# Patient Record
Sex: Male | Born: 1985 | Hispanic: Yes | Marital: Married
Health system: Southern US, Community
[De-identification: ages and names within clinical notes are randomized; demographics above are authoritative.]

## PROBLEM LIST (undated history)

## (undated) DIAGNOSIS — S064X9A Epidural hemorrhage with loss of consciousness of unspecified duration, initial encounter: Secondary | ICD-10-CM

## (undated) DIAGNOSIS — S069X9A Unspecified intracranial injury with loss of consciousness of unspecified duration, initial encounter: Secondary | ICD-10-CM

## (undated) DIAGNOSIS — H9190 Unspecified hearing loss, unspecified ear: Secondary | ICD-10-CM

## (undated) DIAGNOSIS — J9600 Acute respiratory failure, unspecified whether with hypoxia or hypercapnia: Secondary | ICD-10-CM

## (undated) HISTORY — PX: BRAIN SURGERY: SHX531

## (undated) HISTORY — DX: Unspecified intracranial injury with loss of consciousness of unspecified duration, initial encounter: S06.9X9A

## (undated) HISTORY — PX: FRACTURE SURGERY: SHX138

## (undated) HISTORY — DX: Epidural hemorrhage with loss of consciousness of unspecified duration, initial encounter: S06.4X9A

## (undated) HISTORY — DX: Acute respiratory failure, unspecified whether with hypoxia or hypercapnia: J96.00

## (undated) HISTORY — DX: Unspecified hearing loss, unspecified ear: H91.90

---

## 2008-03-17 ENCOUNTER — Inpatient Hospital Stay: Payer: Self-pay | Admitting: Orthopedic Surgery

## 2012-02-14 LAB — COMPREHENSIVE METABOLIC PANEL
Anion Gap: 7 (ref 7–16)
Bilirubin,Total: 0.5 mg/dL (ref 0.2–1.0)
Calcium, Total: 9 mg/dL (ref 8.5–10.1)
Chloride: 104 mmol/L (ref 98–107)
Co2: 25 mmol/L (ref 21–32)
Creatinine: 0.8 mg/dL (ref 0.60–1.30)
EGFR (African American): >60
Glucose: 96 mg/dL (ref 65–99)
Potassium: 3.3 mmol/L — ABNORMAL LOW (ref 3.5–5.1)
SGOT(AST): 22 U/L (ref 15–37)
Sodium: 136 mmol/L (ref 136–145)
Total Protein: 8.4 g/dL — ABNORMAL HIGH (ref 6.4–8.2)

## 2012-02-14 LAB — URINALYSIS, COMPLETE
Bacteria: NONE SEEN
Bilirubin,UR: NEGATIVE
Glucose,UR: NEGATIVE mg/dL (ref 0–75)
Ketone: NEGATIVE
Ph: 6 (ref 4.5–8.0)
RBC,UR: NONE SEEN /HPF (ref 0–5)
Squamous Epithelial: NONE SEEN
WBC UR: NONE SEEN /HPF (ref 0–5)

## 2012-02-14 LAB — DRUG SCREEN, URINE
Barbiturates, Ur Screen: NEGATIVE (ref ?–200)
Cannabinoid 50 Ng, Ur ~~LOC~~: NEGATIVE (ref ?–50)
Cocaine Metabolite,Ur ~~LOC~~: NEGATIVE (ref ?–300)
MDMA (Ecstasy)Ur Screen: NEGATIVE (ref ?–500)
Methadone, Ur Screen: NEGATIVE (ref ?–300)
Opiate, Ur Screen: NEGATIVE (ref ?–300)
Phencyclidine (PCP) Ur S: NEGATIVE (ref ?–25)

## 2012-02-14 LAB — ETHANOL: Ethanol: 209 mg/dL

## 2012-02-14 LAB — CBC
HCT: 44.6 % (ref 40.0–52.0)
MCHC: 33 g/dL (ref 32.0–36.0)
Platelet: 187 10*3/uL (ref 150–440)
RBC: 5.2 10*6/uL (ref 4.40–5.90)
RDW: 13.2 % (ref 11.5–14.5)
WBC: 8 10*3/uL (ref 3.8–10.6)

## 2012-02-15 ENCOUNTER — Inpatient Hospital Stay: Payer: Self-pay | Admitting: Psychiatry

## 2012-10-19 ENCOUNTER — Emergency Department: Payer: Self-pay | Admitting: Emergency Medicine

## 2012-10-19 DIAGNOSIS — J9600 Acute respiratory failure, unspecified whether with hypoxia or hypercapnia: Secondary | ICD-10-CM

## 2012-10-19 DIAGNOSIS — S064X9A Epidural hemorrhage with loss of consciousness of unspecified duration, initial encounter: Secondary | ICD-10-CM

## 2012-10-19 DIAGNOSIS — S069X9A Unspecified intracranial injury with loss of consciousness of unspecified duration, initial encounter: Secondary | ICD-10-CM

## 2012-10-19 DIAGNOSIS — S069XAA Unspecified intracranial injury with loss of consciousness status unknown, initial encounter: Secondary | ICD-10-CM

## 2012-10-19 DIAGNOSIS — S064XAA Epidural hemorrhage with loss of consciousness status unknown, initial encounter: Secondary | ICD-10-CM | POA: Insufficient documentation

## 2012-10-19 HISTORY — DX: Unspecified intracranial injury with loss of consciousness of unspecified duration, initial encounter: S06.9X9A

## 2012-10-19 HISTORY — DX: Unspecified intracranial injury with loss of consciousness status unknown, initial encounter: S06.9XAA

## 2012-10-19 HISTORY — DX: Acute respiratory failure, unspecified whether with hypoxia or hypercapnia: J96.00

## 2012-10-19 HISTORY — DX: Epidural hemorrhage with loss of consciousness status unknown, initial encounter: S06.4XAA

## 2012-10-19 HISTORY — DX: Epidural hemorrhage with loss of consciousness of unspecified duration, initial encounter: S06.4X9A

## 2012-10-19 LAB — COMPREHENSIVE METABOLIC PANEL
Albumin: 4.1 g/dL (ref 3.4–5.0)
Alkaline Phosphatase: 104 U/L (ref 50–136)
Bilirubin,Total: 0.3 mg/dL (ref 0.2–1.0)
Calcium, Total: 8.5 mg/dL (ref 8.5–10.1)
Chloride: 105 mmol/L (ref 98–107)
Co2: 24 mmol/L (ref 21–32)
EGFR (Non-African Amer.): >60
Potassium: 3 mmol/L — ABNORMAL LOW (ref 3.5–5.1)
SGPT (ALT): 27 U/L (ref 12–78)
Sodium: 138 mmol/L (ref 136–145)
Total Protein: 8.2 g/dL (ref 6.4–8.2)

## 2012-10-19 LAB — APTT: Activated PTT: 25.9 secs (ref 23.6–35.9)

## 2012-10-19 LAB — URINALYSIS, COMPLETE
Bacteria: NONE SEEN
Glucose,UR: NEGATIVE mg/dL (ref 0–75)
Leukocyte Esterase: NEGATIVE
Ph: 6 (ref 4.5–8.0)
RBC,UR: <1 /HPF (ref 0–5)
Specific Gravity: 1.005 (ref 1.003–1.030)
WBC UR: <1 /HPF (ref 0–5)

## 2012-10-19 LAB — CBC WITH DIFFERENTIAL/PLATELET
Basophil %: 0.8 %
Eosinophil #: 0.3 10*3/uL (ref 0.0–0.7)
Eosinophil %: 2.6 %
HGB: 15.1 g/dL (ref 13.0–18.0)
Lymphocyte #: 3.4 10*3/uL (ref 1.0–3.6)
Lymphocyte %: 33 %
MCH: 29.2 pg (ref 26.0–34.0)
MCHC: 34.4 g/dL (ref 32.0–36.0)
MCV: 85 fL (ref 80–100)
Monocyte %: 5 %
Neutrophil #: 6.1 10*3/uL (ref 1.4–6.5)
Neutrophil %: 58.6 %
RBC: 5.17 10*6/uL (ref 4.40–5.90)
RDW: 13.4 % (ref 11.5–14.5)
WBC: 10.3 10*3/uL (ref 3.8–10.6)

## 2012-10-19 LAB — ETHANOL: Ethanol %: 0.178 % — ABNORMAL HIGH (ref 0.000–0.080)

## 2012-10-19 LAB — PROTIME-INR
INR: 1
Prothrombin Time: 13 secs (ref 11.5–14.7)

## 2012-10-19 LAB — LIPASE, BLOOD: Lipase: 80 U/L (ref 73–393)

## 2012-12-06 DIAGNOSIS — H9190 Unspecified hearing loss, unspecified ear: Secondary | ICD-10-CM

## 2012-12-06 HISTORY — DX: Unspecified hearing loss, unspecified ear: H91.90

## 2014-06-16 NOTE — Discharge Summary (Signed)
PATIENT NAME:  Darrell Brooks, Darrell Brooks MR#:  [id number] DATE OF BIRTH:  [birth year]  DATE OF ADMISSION:  02/15/2012 DATE OF DISCHARGE:  02/17/2012  HOSPITAL COURSE: See dictated history and physical for details of admission. This 29 year old man came in through the Emergency Room after cutting his left wrist while intoxicated. At the time he did it, he was feeling hopeless and upset with his relationship with his wife and was having some suicidal thoughts. In the hospital, he was cooperative with treatment. His wound was dressed and he was admitted to the psychiatry ward. He has been able to detox off of alcohol without complications. After sobering up, his mood is much improved. He denies feeling depressed. He denied suicidal or homicidal ideation. He is lucid without any loosening of associations or delusions. He gives a history that is not particularly consistent with a full major depressive episode, but with multiple life stresses as well as the use of alcohol. The patient did not clearly need medication and antidepressants were not started. He preferred it this way. He was engaged in groups and activities, and given individual therapy. The patient met with his wife and says that it has been a good reconciliation and he feels better about the relationship. He is agreeable to the idea of going for outpatient followup in the community and has been counseled about the dangers of continued alcohol abuse and that he should stay away from alcohol at this time. He agrees to all of that. He will be discharged with followup appointment at [facility].   DISCHARGE MEDICATIONS: None.   LABORATORY RESULTS: Alcohol level was 209. TSH 2.7, which is normal. Potassium slightly low at 3.3, total protein elevated at 8.4. CBC normal. Drug screen negative. Urinalysis unremarkable.   MENTAL STATUS EXAM AT DISCHARGE: Young man who looks his stated age, appropriate and cooperative with the interaction. Good eye contact. Normal psychomotor  activity. No sign of tremor. Speech normal rate, tone, and volume. Affect smiling, upbeat, reactive and appropriate. Mood stated as being good. Thoughts are lucid with no loosening of associations. No evidence of delusions or loose or psychotic thinking. Denies auditory or visual hallucinations. Denies suicidal or homicidal ideation. Shows good judgment and insight. Normal intelligence. Alert and oriented x 4, only some memory loss of the time he was intoxicated.   DIAGNOSIS PRINCIPLE AND PRIMARY:   AXIS I: Adjustment disorder with mixed behavior and mood disturbance.   SECONDARY DIAGNOSES: AXIS I: Alcohol abuse.  AXIS II: Deferred.  AXIS III: Cuts to his wrists, healing.  AXIS IV: Moderate from difficulty in his relationship with his wife.  AXIS V: Functioning at time of discharge 63.     ____________________________ Gonzella Lex, MD jtc:cs D: 02/17/2012 12:02:29 ET T: 02/18/2012 19:22:53 ET JOB#: [id number]  cc: Gonzella Lex, MD, <Dictator> Gonzella Lex MD ELECTRONICALLY SIGNED 02/19/2012 13:08

## 2014-06-16 NOTE — H&P (Signed)
PATIENT NAME:  Darrell Brooks, Darrell Brooks MR#:  [id number] DATE OF BIRTH:  07-Jan-1986  DATE OF ADMISSION:  02/15/2012  IDENTIFYING INFORMATION AND CHIEF COMPLAINT: A 29 year old man admitted through the Emergency Room on involuntary commitment because of self-inflicted lacerations with suicidal ideation.   CHIEF COMPLAINT: "I just cut myself."   HISTORY OF PRESENT ILLNESS: The patient states that on the day of admission, he and his wife had been having an extended argument for over a day. It revolved around his being jealous of something he discovered on her Facebook page. Apparently the argument ended with her going off to work and he continued to feel upset about it. Later on in the day, he was at home when he decided to drink some beer. He cannot remember exactly how much he drank but figures it was at least three. At some point, he impulsively got the idea that his wife was definitely going to leave him and he should kill himself. He cut his left arm at least twice and decided that he wanted to bleed to death. He was holding his arm over a sink and texting to some relatives of his. They texted or communicated with his wife, and eventually EMS was called and the patient was brought to the Emergency Room. He was intoxicated on arrival here. Drug screen otherwise negative. The patient says that prior to the episode of cutting himself, his mood had been a little bit up and down recently because of ongoing problems he is having with his wife. He and his wife separated last year and just got back together in October. Ever since then, he feels like she has been distant. He feels unsatisfied with the relationship and has been more nervous. He denies that he has been having any suicidal thoughts. He denies any psychotic symptoms. Sleep is generally okay and he does not report any new physical symptoms.   PAST PSYCHIATRIC HISTORY: He denies that he has ever been in a psychiatric hospital and denies that he has ever seen a  mental health provider or been on any medication in the past. As a much younger man, he used to cut himself very superficially on the chest when he was upset but has not done any other self-mutilation and he denies any other suicide attempts.   SUBSTANCE ABUSE HISTORY: He states that normally he drinks only moderately and infrequently. He says the amount that he drank the other day was much in excess of his usual consumption. He denies that he abuses any other drugs.   FAMILY HISTORY: He has a sister who has some mood problems, otherwise negative family history.   SOCIAL HISTORY: The patient has been married for 4 years to his wife. They have a 29-year-old daughter. 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 [place] and his family lives around here. He and his wife separated last year and recently reunited. They are not seeing any kind of counselor.   PAST MEDICAL HISTORY: No significant known ongoing medical problems. He broke his leg a couple of years ago and that his only prior contact I can identify.   REVIEW OF SYSTEMS: He currently does not have many complaints. He says his mood is feeling better. He is not feeling very tired. Denies any suicidal ideation. Denies psychotic symptoms. Denies homicidal ideation. Denies panic attacks. Denies nausea or GI complaints.   MENTAL STATUS EXAMINATION: Casually dressed young man who appears to be in no acute distress and is cooperative with the  interview. Eye contact is good. Psychomotor activity normal. Speech is normal in rate, tone and volume. Affect is reactive, euthymic and appropriate. Mood is stated as being okay. Thoughts are lucid without any loosening of association. No sign of delusions. Denies auditory or visual hallucinations. Denies suicidal or homicidal ideation. Judgment and insight currently appear to be much improved and probably back to baseline. Baseline intelligence appears to be normal. Short and longer term memory  intact except for the time during which he was intoxicated.   PHYSICAL EXAMINATION:  GENERAL: The patient has a bandage on his left wrist where he cut himself. No other skin lesions identified.  HEENT: Pupils are equal and reactive, and face is symmetric. Oral mucosa moist.  MUSCULOSKELETAL: Neck and back nontender to palpation. Full range of motion at all extremities.  NEUROLOGIC: Normal gait. Strength and reflexes symmetric and normal throughout. Cranial nerves symmetric.  LUNGS: Clear with no wheezes.  HEART: Regular rate and rhythm.  ABDOMEN: Soft, nontender. Normal bowel sounds.  VITAL SIGNS: Blood pressure currently 101/56, respirations 20, pulse 70, temperature 97.9.   MEDICATIONS: None.   ALLERGIES: No known drug allergies.   LABORATORY RESULTS: Alcohol level in the Emergency Room was 209. Chemistry panel showed potassium slightly low at 3.3, total protein slightly low at 8.4. CBC normal. Urinalysis normal and drug screen negative.   ASSESSMENT: A 29 year old man who impulsively cut himself while he was intoxicated. He denies symptoms consistent with a major depression prior to that. He does give a past history of having been impulsive and irritable in the past and sometimes acting without thinking. He denies that he has ever tried to kill himself, but he has done some minor self-mutilation in the past. He denies that he has a substance abuse problem but was intoxicated at the time. Going through significant stress in his relationship with his wife.   TREATMENT PLAN: The patient has been admitted to the hospital for safety. Continue on current suicide precautions. Evaluate labs. Evaluate vital signs. Include him in groups and activities as appropriate. Supportive and educational therapy done. Perhaps try to get collateral information. Work on appropriate disposition including referral for outpatient mental health treatment.   DIAGNOSIS PRINCIPLE AND PRIMARY:  AXIS I:  1.  Adjustment  disorder with mixed disturbance of mood and conduct.  2.  Alcohol abuse.  AXIS II: None. AXIS III: Superficial lacerations to left wrist, currently treated.  AXIS IV: Moderate to severe from the relationship with his wife.  AXIS V: Functioning at time of evaluation 55.   ____________________________ Audery Amel, MD jtc:jm D: 02/15/2012 16:36:11 ET T: 02/15/2012 17:38:17 ET JOB#: [id number]  cc: Audery Amel, MD, <Dictator> Audery Amel MD ELECTRONICALLY SIGNED 02/15/2012 23:00

## 2018-04-30 ENCOUNTER — Encounter: Payer: Self-pay | Admitting: Emergency Medicine

## 2018-04-30 ENCOUNTER — Other Ambulatory Visit: Payer: Self-pay

## 2018-04-30 DIAGNOSIS — R319 Hematuria, unspecified: Secondary | ICD-10-CM | POA: Diagnosis not present

## 2018-04-30 LAB — URINALYSIS, COMPLETE (UACMP) WITH MICROSCOPIC
BACTERIA UA: NONE SEEN
Bilirubin Urine: NEGATIVE
GLUCOSE, UA: NEGATIVE mg/dL
KETONES UR: NEGATIVE mg/dL
LEUKOCYTE UA: NEGATIVE
Nitrite: NEGATIVE
PROTEIN: NEGATIVE mg/dL
RBC / HPF: >50 RBC/hpf — ABNORMAL HIGH (ref 0–5)
SQUAMOUS EPITHELIAL / LPF: NONE SEEN (ref 0–5)
Specific Gravity, Urine: 1.013 (ref 1.005–1.030)
WBC, UA: NONE SEEN WBC/hpf (ref 0–5)
pH: 6 (ref 5.0–8.0)

## 2018-04-30 LAB — CBC
HCT: 40.3 % (ref 39.0–52.0)
Hemoglobin: 13.8 g/dL (ref 13.0–17.0)
MCH: 28.6 pg (ref 26.0–34.0)
MCHC: 34.2 g/dL (ref 30.0–36.0)
MCV: 83.4 fL (ref 80.0–100.0)
PLATELETS: 216 10*3/uL (ref 150–400)
RBC: 4.83 MIL/uL (ref 4.22–5.81)
RDW: 12.8 % (ref 11.5–15.5)
WBC: 11.4 10*3/uL — AB (ref 4.0–10.5)
nRBC: 0 % (ref 0.0–0.2)

## 2018-04-30 LAB — BASIC METABOLIC PANEL
ANION GAP: 9 (ref 5–15)
BUN: 22 mg/dL — AB (ref 6–20)
CALCIUM: 9.1 mg/dL (ref 8.9–10.3)
CO2: 25 mmol/L (ref 22–32)
Chloride: 105 mmol/L (ref 98–111)
Creatinine, Ser: 1.06 mg/dL (ref 0.61–1.24)
GFR calc Af Amer: >60 mL/min (ref >60)
GLUCOSE: 99 mg/dL (ref 70–99)
POTASSIUM: 3.6 mmol/L (ref 3.5–5.1)
Sodium: 139 mmol/L (ref 135–145)

## 2018-04-30 NOTE — ED Triage Notes (Signed)
Patient to ER for c/o hematuria since today. Patient states he noticed large clots when he urinated, then had blood urine without clots after clots were passed. Denies any history of the same. Patient reports mild pain to left flank.

## 2018-05-01 ENCOUNTER — Encounter: Payer: Self-pay | Admitting: Urology

## 2018-05-01 ENCOUNTER — Emergency Department: Payer: BLUE CROSS/BLUE SHIELD

## 2018-05-01 ENCOUNTER — Emergency Department
Admission: EM | Admit: 2018-05-01 | Discharge: 2018-05-01 | Disposition: A | Discharge status: Home / Self Care | Payer: BLUE CROSS/BLUE SHIELD | Attending: Emergency Medicine | Admitting: Emergency Medicine

## 2018-05-01 ENCOUNTER — Ambulatory Visit (INDEPENDENT_AMBULATORY_CARE_PROVIDER_SITE_OTHER): Payer: BLUE CROSS/BLUE SHIELD | Admitting: Urology

## 2018-05-01 VITALS — BP 144/81 | HR 90 | Ht 68.0 in | Wt 153.0 lb

## 2018-05-01 DIAGNOSIS — R319 Hematuria, unspecified: Secondary | ICD-10-CM | POA: Diagnosis not present

## 2018-05-01 DIAGNOSIS — R310 Gross hematuria: Secondary | ICD-10-CM

## 2018-05-01 DIAGNOSIS — R361 Hematospermia: Secondary | ICD-10-CM

## 2018-05-01 LAB — URINALYSIS, COMPLETE
Bilirubin, UA: NEGATIVE
Glucose, UA: NEGATIVE
Ketones, UA: NEGATIVE
Leukocytes, UA: NEGATIVE
Nitrite, UA: NEGATIVE
Protein, UA: NEGATIVE
RBC, UA: NEGATIVE
Specific Gravity, UA: 1.025 (ref 1.005–1.030)
Urobilinogen, Ur: 0.2 mg/dL (ref 0.2–1.0)
pH, UA: 6 (ref 5.0–7.5)

## 2018-05-01 LAB — BLADDER SCAN AMB NON-IMAGING

## 2018-05-01 NOTE — Progress Notes (Signed)
05/01/2018 4:30 PM   Darrell Brooks 18-Mar-1985 [id number]  Referring provider: No referring provider defined for this encounter.  Chief Complaint  Patient presents with  . Hematuria    New Patient    HPI: 33 yo M referred for further evaluation of several episodes of painless gross hematuria and hematospermia.  He reports having blood in sperm in 04/21/2018 painless with sexual intercourse.  Following this episode, he voided several times which he indicated it was quite bloody with a few occasional clots.  He had no associated dysuria, urgency, frequency, urethral discharge, fevers or chills.  This happened on one subsequent encounter just a few days ago as well.  The situation was essentially the same, following ejaculation/sexual intercourse with subsequent episode of painless gross hematuria which was self-limited.  He denies any trauma with sexual intercourse.  No pain or loss of erection.  Notably, he was seen and evaluated earlier this morning in the emergency room out of concern.  He underwent a CT stone protocol which was unremarkable.  He did take some aspirin yesterday 1 pm for a head ache just prior to this happening.    Today, he denies any urinary symptoms.  He is sexually active with one partner.  No STI exposures of which he is aware.  No history of STIs.   PMH: Past Medical History:  Diagnosis Date  . Acute respiratory failure (HCC) 10/19/2012  . Epidural hematoma (HCC) 10/19/2012  . Hearing loss 12/06/2012  . TBI (traumatic brain injury) (HCC) 10/19/2012    Surgical History: Past Surgical History:  Procedure Laterality Date  . BRAIN SURGERY    . FRACTURE SURGERY      Home Medications:  Allergies as of 05/01/2018   No Known Allergies     Medication List    as of May 01, 2018  4:30 PM   You have not been prescribed any medications.     Allergies: No Known Allergies  Family History: No family history on file.  Social History:  reports that  he has never smoked. He has never used smokeless tobacco. He reports current alcohol use. No history on file for drug.  ROS: UROLOGY Frequent Urination?: No Hard to postpone urination?: No Burning/pain with urination?: No Get up at night to urinate?: No Leakage of urine?: No Urine stream starts and stops?: No Trouble starting stream?: No Do you have to strain to urinate?: No Blood in urine?: Yes Urinary tract infection?: No Sexually transmitted disease?: No Injury to kidneys or bladder?: No Painful intercourse?: No Weak stream?: No Erection problems?: No Penile pain?: No  Gastrointestinal Nausea?: No Vomiting?: No Indigestion/heartburn?: No Diarrhea?: No Constipation?: No  Constitutional Fever: No Night sweats?: No Weight loss?: No Fatigue?: Yes  Skin Skin rash/lesions?: No Itching?: No  Eyes Blurred vision?: No Double vision?: No  Ears/Nose/Throat Sore throat?: No Sinus problems?: No  Hematologic/Lymphatic Swollen glands?: No Easy bruising?: Yes  Cardiovascular Leg swelling?: No Chest pain?: No  Respiratory Cough?: No Shortness of breath?: No  Endocrine Excessive thirst?: No  Musculoskeletal Back pain?: No Joint pain?: No  Neurological Headaches?: No Dizziness?: No  Psychologic Depression?: No Anxiety?: No  Physical Exam: BP (!) 144/81   Pulse 90   Ht  (1.727 m)   Wt 153 lb (69.4 kg)   BMI 23.26 kg/m   Constitutional:  Alert and oriented, No acute distress. HEENT: [place] AT, moist mucus membranes.  Trachea midline, no masses. Cardiovascular: No clubbing, cyanosis, or edema. Respiratory: Normal respiratory effort,  no increased work of breathing. GI: Abdomen is soft, nontender, nondistended, no abdominal masses GU: Normal phallus with orthotopic meatus.  No urethral discharge.  Normal bilateral descended testicles. Rectal: Normal sphincter tone.  Nontender.  No nodules or lesions, small prostate. Skin: No rashes, bruises or  suspicious lesions. Neurologic: Grossly intact, no focal deficits, moving all 4 extremities. Psychiatric: Normal mood and affect.  Laboratory Data: Lab Results  Component Value Date   WBC 11.4 (H) 04/30/2018   HGB 13.8 04/30/2018   HCT 40.3 04/30/2018   MCV 83.4 04/30/2018   PLT 216 04/30/2018    Lab Results  Component Value Date   CREATININE 1.06 04/30/2018    Urinalysis    Component Value Date/Time   COLORURINE STRAW (A) 04/30/2018 2232   APPEARANCEUR CLEAR (A) 04/30/2018 2232   APPEARANCEUR Clear 10/19/2012 0417   LABSPEC 1.013 04/30/2018 2232   LABSPEC 1.005 10/19/2012 0417   PHURINE 6.0 04/30/2018 2232   GLUCOSEU NEGATIVE 04/30/2018 2232   GLUCOSEU Negative 10/19/2012 0417   HGBUR LARGE (A) 04/30/2018 2232   BILIRUBINUR NEGATIVE 04/30/2018 2232   BILIRUBINUR Negative 10/19/2012 0417   KETONESUR NEGATIVE 04/30/2018 2232   PROTEINUR NEGATIVE 04/30/2018 2232   NITRITE NEGATIVE 04/30/2018 2232   LEUKOCYTESUR NEGATIVE 04/30/2018 2232   LEUKOCYTESUR Negative 10/19/2012 0417    Lab Results  Component Value Date   BACTERIA NONE SEEN 04/30/2018    Pertinent Imaging: Results for orders placed during the hospital encounter of 05/01/18  CT Renal Stone Study   Narrative CLINICAL DATA:  Hematuria. Patient reports mild left flank pain.  EXAM: CT ABDOMEN AND PELVIS WITHOUT CONTRAST  TECHNIQUE: Multidetector CT imaging of the abdomen and pelvis was performed following the standard protocol without IV contrast.  COMPARISON:  CT 10/19/2012  FINDINGS: Lower chest: Lung bases are clear.  Hepatobiliary: No focal liver abnormality is seen. No gallstones, gallbladder wall thickening, or biliary dilatation.  Pancreas: No ductal dilatation or inflammation.  Spleen: Normal in size without focal abnormality.  Adrenals/Urinary Tract: Normal adrenal glands. No hydronephrosis or perinephric edema. No urolithiasis. Both ureters are decompressed without stones along the  course. Urinary bladder is physiologically distended. No bladder wall thickening or stone. No perivesicular edema. No urethral stones visualized.  Stomach/Bowel: Stomach is within normal limits. Appendix appears normal. No evidence of bowel wall thickening, distention, or inflammatory changes.  Vascular/Lymphatic: Normal course and caliber of abdominal vascular structures. No enlarged lymph nodes.  Reproductive: Prostate is unremarkable.  Other: No free air, free fluid, or intra-abdominal fluid collection.  Musculoskeletal: There are no acute or suspicious osseous abnormalities. Surgical hardware in the right proximal femur is partially included.  IMPRESSION: No renal stones or obstructive uropathy. No explanation for hematuria.   Electronically Signed   By: Narda Rutherford M.D.   On: 05/01/2018 01:19    PVR 22 cc   Assessment & Plan:    1. Gross hematuria Episodes of gross hematuria following intercourse/episodes of hematospermia  Given his age and lack of comorbidities, I have low suspicion for any significant pathology.  That being said, given the degree of his hematuria, would recommend cystoscopy to rule out underlying urethral anatomic abnormalities, lesions, tumors, etc.  He is agreeable to return for office cystoscopy. - Urinalysis, Complete - BLADDER SCAN AMB NON-IMAGING - CULTURE, URINE COMPREHENSIVE  2. Hematospermia  I explained to the patient some of the conditions that may cause hematospermia, such as: disorders of the prostate gland, seminal vesicles, spermatic cord, and ejaculatory duct system; urogenital infections  including sexually transmitted infections (eg, chlamydia, herpes simplex virus, gonorrhea, trichomonas); metastatic cancers; vascular malformations; congenital and drug-induced bleeding disorders; and even frequent daily ejaculation over a period of several weeks.  I reassured him that his exam was normal and that we may never discover a reason  for his hematospermia and it is most likely a benign symptom.      Schedule cystoscopy  Vanna Scotland, MD  [facility] [address], [address] [place], [place] [address] [phone]

## 2018-05-01 NOTE — ED Provider Notes (Signed)
[facility] Emergency Department Provider Note ____________________________________   First MD Initiated Contact with Patient 05/01/18 [phone]     (approximate)  I have reviewed the triage vital signs and the nursing notes.   HISTORY  Chief Complaint Hematuria    HPI Darrell Brooks is a 34 y.o. male to the emergency department with painless hematuria x1 week.  Patient also admits to passing large clots.  Patient states episode tonight a large clot was at his external urethral meatus and he states he was able to push it out followed by hematuria.  Patient denies any pain.  Patient denies any fever.  Patient denies any nausea or vomiting.            There are no active problems to display for this patient.   Past Surgical History:  Procedure Laterality Date  . BRAIN SURGERY    . FRACTURE SURGERY      Prior to Admission medications   Not on File    Allergies Patient has no known allergies.  No family history on file.  Social History Social History   Tobacco Use  . Smoking status: Never Smoker  . Smokeless tobacco: Never Used  Substance Use Topics  . Alcohol use: Yes  . Drug use: Not on file    Review of Systems Constitutional: No fever/chills Eyes: No visual changes. ENT: No sore throat. Cardiovascular: Denies chest pain. Respiratory: Denies shortness of breath. Gastrointestinal: No abdominal pain.  No nausea, no vomiting.  No diarrhea.  No constipation. Genitourinary: Negative for dysuria.  Positive for hematuria Musculoskeletal: Negative for neck pain.  Negative for back pain. Integumentary: Negative for rash. Neurological: Negative for headaches, focal weakness or numbness.   ____________________________________________   PHYSICAL EXAM:  VITAL SIGNS: ED Triage Vitals [04/30/18 2228]  Enc Vitals Group     BP (!) 150/93     Pulse Rate 77     Resp 20     Temp 97.8 F (36.6 C)     Temp Source Oral     SpO2 99 %   Weight 69.4 kg (153 lb)     Height 1.727 m (5\' 8" )     Head Circumference      Peak Flow      Pain Score 2     Pain Loc      Pain Edu?      Excl. in GC?     Constitutional: Alert and oriented. Well appearing and in no acute distress. Eyes: Conjunctivae are normal. Mouth/Throat: Mucous membranes are moist. Oropharynx non-erythematous. Neck: No stridor.   Cardiovascular: Normal rate, regular rhythm. Good peripheral circulation. Grossly normal heart sounds. Respiratory: Normal respiratory effort.  No retractions. Lungs CTAB. Gastrointestinal: Soft and nontender. No distention.  Musculoskeletal: No lower extremity tenderness nor edema. No gross deformities of extremities. Neurologic:  Normal speech and language. No gross focal neurologic deficits are appreciated.  Skin:  Skin is warm, dry and intact. No rash noted. Psychiatric: Mood and affect are normal. Speech and behavior are normal.  ____________________________________________   LABS (all labs ordered are listed, but only abnormal results are displayed)  Labs Reviewed  URINALYSIS, COMPLETE (UACMP) WITH MICROSCOPIC - Abnormal; Notable for the following components:      Result Value   Color, Urine STRAW (*)    APPearance CLEAR (*)    Hgb urine dipstick LARGE (*)    RBC / HPF >50 (*)    All other components within normal limits  BASIC METABOLIC PANEL -  Abnormal; Notable for the following components:   BUN 22 (*)    All other components within normal limits  CBC - Abnormal; Notable for the following components:   WBC 11.4 (*)    All other components within normal limits   ____________________________________________  RADIOLOGY I, Dontreal [place] Kavari Parrillo, personally viewed and evaluated these images (plain radiographs) as part of my medical decision making, as well as reviewing the written report by the radiologist.  ED MD interpretation: CT renal revealed no acute abnormality per radiologist.  Official radiology report(s): Ct  Renal Stone Study  Result Date: 05/01/2018 CLINICAL DATA:  Hematuria. Patient reports mild left flank pain. EXAM: CT ABDOMEN AND PELVIS WITHOUT CONTRAST TECHNIQUE: Multidetector CT imaging of the abdomen and pelvis was performed following the standard protocol without IV contrast. COMPARISON:  CT 10/19/2012 FINDINGS: Lower chest: Lung bases are clear. Hepatobiliary: No focal liver abnormality is seen. No gallstones, gallbladder wall thickening, or biliary dilatation. Pancreas: No ductal dilatation or inflammation. Spleen: Normal in size without focal abnormality. Adrenals/Urinary Tract: Normal adrenal glands. No hydronephrosis or perinephric edema. No urolithiasis. Both ureters are decompressed without stones along the course. Urinary bladder is physiologically distended. No bladder wall thickening or stone. No perivesicular edema. No urethral stones visualized. Stomach/Bowel: Stomach is within normal limits. Appendix appears normal. No evidence of bowel wall thickening, distention, or inflammatory changes. Vascular/Lymphatic: Normal course and caliber of abdominal vascular structures. No enlarged lymph nodes. Reproductive: Prostate is unremarkable. Other: No free air, free fluid, or intra-abdominal fluid collection. Musculoskeletal: There are no acute or suspicious osseous abnormalities. Surgical hardware in the right proximal femur is partially included. IMPRESSION: No renal stones or obstructive uropathy. No explanation for hematuria. Electronically Signed   By: Narda Rutherford M.D.   On: 05/01/2018 01:19     Procedures   ____________________________________________   INITIAL IMPRESSION / MDM / ASSESSMENT AND PLAN / ED COURSE  As part of my medical decision making, I reviewed the following data within the electronic MEDICAL RECORD NUMBER  33 year old male presenting with above-stated history and physical exam secondary to painless hematuria.  Consider the possibility of nephrolithiasis,  ureterolithiasis, bladder mass, renal mass.  CT renal protocol performed which revealed no acute abnormality.  I spoke with the patient at length regarding the necessity of following up with urology for further outpatient evaluation.  Patient referred to Dr. Apolinar Junes ____________________________________________  FINAL CLINICAL IMPRESSION(S) / ED DIAGNOSES  Final diagnoses:  Hematuria     MEDICATIONS GIVEN DURING THIS VISIT:  Medications - No data to display   ED Discharge Orders    None       Note:  This document was prepared using Dragon voice recognition software and may include unintentional dictation errors.   Darci Current, MD 05/01/18 [id number]

## 2018-05-04 LAB — CULTURE, URINE COMPREHENSIVE

## 2018-05-15 ENCOUNTER — Ambulatory Visit: Payer: BLUE CROSS/BLUE SHIELD | Admitting: Urology

## 2018-05-15 ENCOUNTER — Encounter: Payer: Self-pay | Admitting: Urology

## 2018-05-15 ENCOUNTER — Other Ambulatory Visit: Payer: Self-pay

## 2018-05-15 VITALS — BP 144/83 | HR 92 | Ht 68.0 in | Wt 152.0 lb

## 2018-05-15 DIAGNOSIS — R310 Gross hematuria: Secondary | ICD-10-CM

## 2018-05-15 NOTE — Progress Notes (Signed)
    05/15/18  CC:  Chief Complaint  Patient presents with  . Cysto    HPI: 33 year old male with hematospermia and gross hematuria who presents today for cystoscopy for further evaluation.  See previous note for details.  He, he reports that he said no further episodes of gross hematuria.  His hematospermia is also clearing.  Blood pressure (!) 144/83, pulse 92, height 5\' 8"  (1.727 m), weight 152 lb (68.9 kg). NED. A&Ox3.   No respiratory distress   Abd soft, NT, ND Normal phallus with bilateral descended testicles  Cystoscopy Procedure Note  Patient identification was confirmed, informed consent was obtained, and patient was prepped using Betadine solution.  Lidocaine jelly was administered per urethral meatus.     Pre-Procedure: - Inspection reveals a normal caliber ureteral meatus.  Procedure: The flexible cystoscope was introduced without difficulty - No urethral strictures/lesions are present. - Normal prostate  - Normal bladder neck - Bilateral ureteral orifices identified - Bladder mucosa  reveals no ulcers, tumors, or lesions - No bladder stones - No trabeculation  Retroflexion unremarkable   Post-Procedure: - Patient tolerated the procedure well  Assessment/ Plan:  1. Gross hematuria/ hematospermia Normal cystoscopy today reassuring He was advised if he has any further episodes of gross hematuria or persistent hematospermia, will consider further imaging possibly in the form of prostate MRI to evaluate his ejaculatory ducts - Urinalysis, Complete  F/u prn  Vanna Scotland, MD

## 2018-05-16 LAB — URINALYSIS, COMPLETE
Bilirubin, UA: NEGATIVE
Glucose, UA: NEGATIVE
Ketones, UA: NEGATIVE
Leukocytes, UA: NEGATIVE
Nitrite, UA: NEGATIVE
PROTEIN UA: NEGATIVE
RBC, UA: NEGATIVE
Specific Gravity, UA: 1.015 (ref 1.005–1.030)
Urobilinogen, Ur: 0.2 mg/dL (ref 0.2–1.0)
pH, UA: 7 (ref 5.0–7.5)

## 2018-05-29 ENCOUNTER — Ambulatory Visit: Payer: Self-pay | Admitting: Urology

## 2020-09-11 ENCOUNTER — Inpatient Hospital Stay (HOSPITAL_COMMUNITY)
Admission: EM | Admit: 2020-09-11 | Discharge: 2020-09-27 | DRG: 963 | Disposition: E | Payer: No Typology Code available for payment source | Attending: Surgery | Admitting: Surgery

## 2020-09-11 DIAGNOSIS — E876 Hypokalemia: Secondary | ICD-10-CM | POA: Diagnosis present

## 2020-09-11 DIAGNOSIS — T1490XA Injury, unspecified, initial encounter: Secondary | ICD-10-CM | POA: Diagnosis not present

## 2020-09-11 DIAGNOSIS — I469 Cardiac arrest, cause unspecified: Secondary | ICD-10-CM | POA: Diagnosis present

## 2020-09-11 DIAGNOSIS — Z23 Encounter for immunization: Secondary | ICD-10-CM

## 2020-09-11 DIAGNOSIS — Z419 Encounter for procedure for purposes other than remedying health state, unspecified: Secondary | ICD-10-CM

## 2020-09-11 DIAGNOSIS — S36039A Unspecified laceration of spleen, initial encounter: Secondary | ICD-10-CM | POA: Diagnosis present

## 2020-09-11 DIAGNOSIS — S270XXA Traumatic pneumothorax, initial encounter: Secondary | ICD-10-CM | POA: Diagnosis present

## 2020-09-11 DIAGNOSIS — Y92410 Unspecified street and highway as the place of occurrence of the external cause: Secondary | ICD-10-CM

## 2020-09-11 DIAGNOSIS — S14109A Unspecified injury at unspecified level of cervical spinal cord, initial encounter: Secondary | ICD-10-CM | POA: Diagnosis present

## 2020-09-11 DIAGNOSIS — G40901 Epilepsy, unspecified, not intractable, with status epilepticus: Secondary | ICD-10-CM | POA: Diagnosis not present

## 2020-09-11 DIAGNOSIS — S51812A Laceration without foreign body of left forearm, initial encounter: Secondary | ICD-10-CM | POA: Diagnosis present

## 2020-09-11 DIAGNOSIS — J939 Pneumothorax, unspecified: Secondary | ICD-10-CM | POA: Diagnosis present

## 2020-09-11 DIAGNOSIS — R069 Unspecified abnormalities of breathing: Secondary | ICD-10-CM

## 2020-09-11 DIAGNOSIS — S36031A Moderate laceration of spleen, initial encounter: Secondary | ICD-10-CM | POA: Diagnosis present

## 2020-09-11 DIAGNOSIS — S01511A Laceration without foreign body of lip, initial encounter: Secondary | ICD-10-CM | POA: Diagnosis present

## 2020-09-11 DIAGNOSIS — R739 Hyperglycemia, unspecified: Secondary | ICD-10-CM | POA: Diagnosis present

## 2020-09-11 DIAGNOSIS — S12500A Unspecified displaced fracture of sixth cervical vertebra, initial encounter for closed fracture: Secondary | ICD-10-CM | POA: Diagnosis present

## 2020-09-11 DIAGNOSIS — G9382 Brain death: Secondary | ICD-10-CM | POA: Diagnosis not present

## 2020-09-11 DIAGNOSIS — Z20822 Contact with and (suspected) exposure to covid-19: Secondary | ICD-10-CM | POA: Diagnosis present

## 2020-09-11 DIAGNOSIS — G931 Anoxic brain damage, not elsewhere classified: Secondary | ICD-10-CM

## 2020-09-11 DIAGNOSIS — Z452 Encounter for adjustment and management of vascular access device: Secondary | ICD-10-CM

## 2020-09-11 DIAGNOSIS — I959 Hypotension, unspecified: Secondary | ICD-10-CM | POA: Diagnosis not present

## 2020-09-11 DIAGNOSIS — J9601 Acute respiratory failure with hypoxia: Secondary | ICD-10-CM
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
## 2020-09-12 ENCOUNTER — Inpatient Hospital Stay (HOSPITAL_COMMUNITY): Payer: No Typology Code available for payment source

## 2020-09-12 ENCOUNTER — Emergency Department (HOSPITAL_COMMUNITY): Payer: No Typology Code available for payment source

## 2020-09-12 ENCOUNTER — Other Ambulatory Visit: Payer: Self-pay

## 2020-09-12 DIAGNOSIS — S12500A Unspecified displaced fracture of sixth cervical vertebra, initial encounter for closed fracture: Secondary | ICD-10-CM | POA: Diagnosis present

## 2020-09-12 DIAGNOSIS — J9601 Acute respiratory failure with hypoxia: Secondary | ICD-10-CM | POA: Diagnosis present

## 2020-09-12 DIAGNOSIS — Z20822 Contact with and (suspected) exposure to covid-19: Secondary | ICD-10-CM | POA: Diagnosis present

## 2020-09-12 DIAGNOSIS — Y92410 Unspecified street and highway as the place of occurrence of the external cause: Secondary | ICD-10-CM | POA: Diagnosis not present

## 2020-09-12 DIAGNOSIS — S51812A Laceration without foreign body of left forearm, initial encounter: Secondary | ICD-10-CM | POA: Diagnosis present

## 2020-09-12 DIAGNOSIS — I959 Hypotension, unspecified: Secondary | ICD-10-CM | POA: Diagnosis not present

## 2020-09-12 DIAGNOSIS — I469 Cardiac arrest, cause unspecified: Secondary | ICD-10-CM | POA: Diagnosis present

## 2020-09-12 DIAGNOSIS — Z23 Encounter for immunization: Secondary | ICD-10-CM | POA: Diagnosis not present

## 2020-09-12 DIAGNOSIS — S01511A Laceration without foreign body of lip, initial encounter: Secondary | ICD-10-CM | POA: Diagnosis present

## 2020-09-12 DIAGNOSIS — E876 Hypokalemia: Secondary | ICD-10-CM | POA: Diagnosis present

## 2020-09-12 DIAGNOSIS — G931 Anoxic brain damage, not elsewhere classified: Secondary | ICD-10-CM | POA: Diagnosis present

## 2020-09-12 DIAGNOSIS — R739 Hyperglycemia, unspecified: Secondary | ICD-10-CM | POA: Diagnosis present

## 2020-09-12 DIAGNOSIS — R569 Unspecified convulsions: Secondary | ICD-10-CM

## 2020-09-12 DIAGNOSIS — S270XXA Traumatic pneumothorax, initial encounter: Secondary | ICD-10-CM | POA: Diagnosis present

## 2020-09-12 DIAGNOSIS — T1490XA Injury, unspecified, initial encounter: Secondary | ICD-10-CM | POA: Diagnosis present

## 2020-09-12 DIAGNOSIS — G9382 Brain death: Secondary | ICD-10-CM | POA: Diagnosis not present

## 2020-09-12 DIAGNOSIS — G40901 Epilepsy, unspecified, not intractable, with status epilepticus: Secondary | ICD-10-CM | POA: Diagnosis not present

## 2020-09-12 DIAGNOSIS — S36031A Moderate laceration of spleen, initial encounter: Secondary | ICD-10-CM | POA: Diagnosis present

## 2020-09-12 LAB — COMPREHENSIVE METABOLIC PANEL
ALT: 204 U/L — ABNORMAL HIGH (ref 0–44)
ALT: 177 U/L — ABNORMAL HIGH (ref 0–44)
AST: 291 U/L — ABNORMAL HIGH (ref 15–41)
AST: 226 U/L — ABNORMAL HIGH (ref 15–41)
Albumin: 3.7 g/dL (ref 3.5–5.0)
Albumin: 3.4 g/dL — ABNORMAL LOW (ref 3.5–5.0)
Alkaline Phosphatase: 93 U/L (ref 38–126)
Alkaline Phosphatase: 69 U/L (ref 38–126)
Anion gap: 14 (ref 5–15)
Anion gap: 10 (ref 5–15)
BUN: 14 mg/dL (ref 6–20)
BUN: 11 mg/dL (ref 6–20)
CO2: 21 mmol/L — ABNORMAL LOW (ref 22–32)
CO2: 19 mmol/L — ABNORMAL LOW (ref 22–32)
Calcium: 7.9 mg/dL — ABNORMAL LOW (ref 8.9–10.3)
Calcium: 8 mg/dL — ABNORMAL LOW (ref 8.9–10.3)
Chloride: 102 mmol/L (ref 98–111)
Chloride: 108 mmol/L (ref 98–111)
Creatinine, Ser: 1.22 mg/dL (ref 0.61–1.24)
Creatinine, Ser: 0.99 mg/dL (ref 0.61–1.24)
GFR, Estimated: >60 mL/min (ref >60)
GFR, Estimated: >60 mL/min (ref >60)
Glucose, Bld: 135 mg/dL — ABNORMAL HIGH (ref 70–99)
Glucose, Bld: 126 mg/dL — ABNORMAL HIGH (ref 70–99)
Potassium: 3.7 mmol/L (ref 3.5–5.1)
Potassium: 4.1 mmol/L (ref 3.5–5.1)
Sodium: 137 mmol/L (ref 135–145)
Sodium: 137 mmol/L (ref 135–145)
Total Bilirubin: 0.5 mg/dL (ref 0.3–1.2)
Total Bilirubin: 0.5 mg/dL (ref 0.3–1.2)
Total Protein: 6.9 g/dL (ref 6.5–8.1)
Total Protein: 6.4 g/dL — ABNORMAL LOW (ref 6.5–8.1)

## 2020-09-12 LAB — I-STAT ARTERIAL BLOOD GAS, ED
Acid-base deficit: 8 mmol/L — ABNORMAL HIGH (ref 0.0–2.0)
Bicarbonate: 19.6 mmol/L — ABNORMAL LOW (ref 20.0–28.0)
Calcium, Ion: 1.06 mmol/L — ABNORMAL LOW (ref 1.15–1.40)
HCT: 39 % (ref 39.0–52.0)
Hemoglobin: 13.3 g/dL (ref 13.0–17.0)
O2 Saturation: 100 %
Potassium: 3.2 mmol/L — ABNORMAL LOW (ref 3.5–5.1)
Sodium: 139 mmol/L (ref 135–145)
TCO2: 21 mmol/L — ABNORMAL LOW (ref 22–32)
pCO2 arterial: 47.1 mmHg (ref 32.0–48.0)
pH, Arterial: 7.227 — ABNORMAL LOW (ref 7.350–7.450)
pO2, Arterial: 536 mmHg — ABNORMAL HIGH (ref 83.0–108.0)

## 2020-09-12 LAB — CBC
HCT: 34.4 % — ABNORMAL LOW (ref 39.0–52.0)
HCT: 38.9 % — ABNORMAL LOW (ref 39.0–52.0)
HCT: 41.8 % (ref 39.0–52.0)
HCT: 45.1 % (ref 39.0–52.0)
HCT: 45.8 % (ref 39.0–52.0)
Hemoglobin: 11.6 g/dL — ABNORMAL LOW (ref 13.0–17.0)
Hemoglobin: 13.2 g/dL (ref 13.0–17.0)
Hemoglobin: 14.1 g/dL (ref 13.0–17.0)
Hemoglobin: 14.7 g/dL (ref 13.0–17.0)
Hemoglobin: 15.4 g/dL (ref 13.0–17.0)
MCH: 29.6 pg (ref 26.0–34.0)
MCH: 29.8 pg (ref 26.0–34.0)
MCH: 30.1 pg (ref 26.0–34.0)
MCH: 30.1 pg (ref 26.0–34.0)
MCH: 30.1 pg (ref 26.0–34.0)
MCHC: 32.6 g/dL (ref 30.0–36.0)
MCHC: 33.6 g/dL (ref 30.0–36.0)
MCHC: 33.7 g/dL (ref 30.0–36.0)
MCHC: 33.7 g/dL (ref 30.0–36.0)
MCHC: 33.9 g/dL (ref 30.0–36.0)
MCV: 88.6 fL (ref 80.0–100.0)
MCV: 88.8 fL (ref 80.0–100.0)
MCV: 89.1 fL (ref 80.0–100.0)
MCV: 89.1 fL (ref 80.0–100.0)
MCV: 90.9 fL (ref 80.0–100.0)
Platelets: 179 10*3/uL (ref 150–400)
Platelets: 205 10*3/uL (ref 150–400)
Platelets: 225 10*3/uL (ref 150–400)
Platelets: 227 K/uL (ref 150–400)
Platelets: 228 10*3/uL (ref 150–400)
RBC: 3.86 MIL/uL — ABNORMAL LOW (ref 4.22–5.81)
RBC: 4.39 MIL/uL (ref 4.22–5.81)
RBC: 4.69 MIL/uL (ref 4.22–5.81)
RBC: 4.96 MIL/uL (ref 4.22–5.81)
RBC: 5.16 MIL/uL (ref 4.22–5.81)
RDW: 13.4 % (ref 11.5–15.5)
RDW: 13.5 % (ref 11.5–15.5)
RDW: 13.6 % (ref 11.5–15.5)
RDW: 13.7 % (ref 11.5–15.5)
RDW: 14 % (ref 11.5–15.5)
WBC: 10.2 K/uL (ref 4.0–10.5)
WBC: 11.3 10*3/uL — ABNORMAL HIGH (ref 4.0–10.5)
WBC: 12.4 10*3/uL — ABNORMAL HIGH (ref 4.0–10.5)
WBC: 8.7 10*3/uL (ref 4.0–10.5)
WBC: 9.8 10*3/uL (ref 4.0–10.5)
nRBC: 0 % (ref 0.0–0.2)
nRBC: 0 % (ref 0.0–0.2)
nRBC: 0 % (ref 0.0–0.2)
nRBC: 0 % (ref 0.0–0.2)
nRBC: 0 % (ref 0.0–0.2)

## 2020-09-12 LAB — URINALYSIS, ROUTINE W REFLEX MICROSCOPIC
Bilirubin Urine: NEGATIVE
Glucose, UA: 150 mg/dL — AB
Ketones, ur: NEGATIVE mg/dL
Leukocytes,Ua: NEGATIVE
Nitrite: NEGATIVE
Protein, ur: 30 mg/dL — AB
Specific Gravity, Urine: 1.028 (ref 1.005–1.030)
pH: 6 (ref 5.0–8.0)

## 2020-09-12 LAB — SAMPLE TO BLOOD BANK

## 2020-09-12 LAB — COMPREHENSIVE METABOLIC PANEL WITH GFR
ALT: 194 U/L — ABNORMAL HIGH (ref 0–44)
AST: 300 U/L — ABNORMAL HIGH (ref 15–41)
Albumin: 3.7 g/dL (ref 3.5–5.0)
Alkaline Phosphatase: 111 U/L (ref 38–126)
Anion gap: 17 — ABNORMAL HIGH (ref 5–15)
BUN: 17 mg/dL (ref 6–20)
CO2: 14 mmol/L — ABNORMAL LOW (ref 22–32)
Calcium: 8.1 mg/dL — ABNORMAL LOW (ref 8.9–10.3)
Chloride: 104 mmol/L (ref 98–111)
Creatinine, Ser: 1.58 mg/dL — ABNORMAL HIGH (ref 0.61–1.24)
GFR, Estimated: 59 mL/min — ABNORMAL LOW
Glucose, Bld: 272 mg/dL — ABNORMAL HIGH (ref 70–99)
Potassium: 2.9 mmol/L — ABNORMAL LOW (ref 3.5–5.1)
Sodium: 135 mmol/L (ref 135–145)
Total Bilirubin: 0.8 mg/dL (ref 0.3–1.2)
Total Protein: 7.1 g/dL (ref 6.5–8.1)

## 2020-09-12 LAB — I-STAT CHEM 8, ED
BUN: 19 mg/dL (ref 6–20)
Calcium, Ion: 0.99 mmol/L — ABNORMAL LOW (ref 1.15–1.40)
Chloride: 105 mmol/L (ref 98–111)
Creatinine, Ser: 1.7 mg/dL — ABNORMAL HIGH (ref 0.61–1.24)
Glucose, Bld: 266 mg/dL — ABNORMAL HIGH (ref 70–99)
HCT: 47 % (ref 39.0–52.0)
Hemoglobin: 16 g/dL (ref 13.0–17.0)
Potassium: 2.9 mmol/L — ABNORMAL LOW (ref 3.5–5.1)
Sodium: 138 mmol/L (ref 135–145)
TCO2: 16 mmol/L — ABNORMAL LOW (ref 22–32)

## 2020-09-12 LAB — PROTIME-INR
INR: 1.1 (ref 0.8–1.2)
Prothrombin Time: 14 seconds (ref 11.4–15.2)

## 2020-09-12 LAB — MRSA NEXT GEN BY PCR, NASAL: MRSA by PCR Next Gen: NOT DETECTED

## 2020-09-12 LAB — TROPONIN I (HIGH SENSITIVITY)
Troponin I (High Sensitivity): 20 ng/L — ABNORMAL HIGH
Troponin I (High Sensitivity): 335 ng/L (ref <18)
Troponin I (High Sensitivity): 379 ng/L (ref <18)

## 2020-09-12 LAB — MAGNESIUM: Magnesium: 1.7 mg/dL (ref 1.7–2.4)

## 2020-09-12 LAB — GLUCOSE, CAPILLARY
Glucose-Capillary: 101 mg/dL — ABNORMAL HIGH (ref 70–99)
Glucose-Capillary: 117 mg/dL — ABNORMAL HIGH (ref 70–99)
Glucose-Capillary: 121 mg/dL — ABNORMAL HIGH (ref 70–99)
Glucose-Capillary: 127 mg/dL — ABNORMAL HIGH (ref 70–99)
Glucose-Capillary: 131 mg/dL — ABNORMAL HIGH (ref 70–99)
Glucose-Capillary: 98 mg/dL (ref 70–99)

## 2020-09-12 LAB — RAPID URINE DRUG SCREEN, HOSP PERFORMED
Amphetamines: NOT DETECTED
Barbiturates: NOT DETECTED
Benzodiazepines: NOT DETECTED
Cocaine: NOT DETECTED
Opiates: NOT DETECTED
Tetrahydrocannabinol: NOT DETECTED

## 2020-09-12 LAB — RESP PANEL BY RT-PCR (FLU A&B, COVID) ARPGX2
Influenza A by PCR: NEGATIVE
Influenza B by PCR: NEGATIVE
SARS Coronavirus 2 by RT PCR: NEGATIVE

## 2020-09-12 LAB — ETHANOL: Alcohol, Ethyl (B): 231 mg/dL — ABNORMAL HIGH

## 2020-09-12 LAB — HEMOGLOBIN A1C
Hgb A1c MFr Bld: 5.3 % (ref 4.8–5.6)
Mean Plasma Glucose: 105.41 mg/dL

## 2020-09-12 LAB — HIV ANTIBODY (ROUTINE TESTING W REFLEX): HIV Screen 4th Generation wRfx: NONREACTIVE

## 2020-09-12 LAB — LACTIC ACID, PLASMA
Lactic Acid, Venous: 2.5 mmol/L (ref 0.5–1.9)
Lactic Acid, Venous: 7.5 mmol/L (ref 0.5–1.9)

## 2020-09-12 MED ORDER — MIDAZOLAM BOLUS VIA INFUSION
10.0000 mg | Freq: Once | INTRAVENOUS | Status: AC
  Administered 2020-09-12: 10 mg via INTRAVENOUS
  Filled 2020-09-12: qty 10

## 2020-09-12 MED ORDER — POTASSIUM CHLORIDE IN NACL 20-0.9 MEQ/L-% IV SOLN
INTRAVENOUS | Status: DC
  Filled 2020-09-12 (×8): qty 1000

## 2020-09-12 MED ORDER — ORAL CARE MOUTH RINSE
15.0000 mL | OROMUCOSAL | Status: DC
  Administered 2020-09-12 – 2020-09-15 (×36): 15 mL via OROMUCOSAL

## 2020-09-12 MED ORDER — ORAL CARE MOUTH RINSE: 15.0000 mL | OROMUCOSAL | Status: DC

## 2020-09-12 MED ORDER — CHLORHEXIDINE GLUCONATE 0.12% ORAL RINSE (MEDLINE KIT): 15.0000 mL | Freq: Two times a day (BID) | OROMUCOSAL | Status: DC

## 2020-09-12 MED ORDER — POTASSIUM CHLORIDE 10 MEQ/100ML IV SOLN
10.0000 meq | INTRAVENOUS | Status: AC
  Administered 2020-09-12 (×3): 10 meq via INTRAVENOUS
  Filled 2020-09-12 (×3): qty 100

## 2020-09-12 MED ORDER — DOCUSATE SODIUM 50 MG/5ML PO LIQD
100.0000 mg | Freq: Two times a day (BID) | ORAL | Status: DC
  Administered 2020-09-12 – 2020-09-14 (×4): 100 mg
  Filled 2020-09-12 (×4): qty 10

## 2020-09-12 MED ORDER — PANTOPRAZOLE SODIUM 40 MG PO TBEC: 40.0000 mg | DELAYED_RELEASE_TABLET | Freq: Every day | ORAL | Status: DC

## 2020-09-12 MED ORDER — ONDANSETRON 4 MG PO TBDP: 4.0000 mg | ORAL_TABLET | Freq: Four times a day (QID) | ORAL | Status: DC | PRN

## 2020-09-12 MED ORDER — PROPOFOL 1000 MG/100ML IV EMUL
40.0000 ug/kg/min | INTRAVENOUS | Status: DC
  Administered 2020-09-12: 60 ug/kg/min via INTRAVENOUS
  Administered 2020-09-12 (×4): 80 ug/kg/min via INTRAVENOUS
  Administered 2020-09-12: 20 ug/kg/min via INTRAVENOUS
  Administered 2020-09-13 (×2): 40 ug/kg/min via INTRAVENOUS
  Administered 2020-09-13: 80 ug/kg/min via INTRAVENOUS
  Administered 2020-09-13 – 2020-09-14 (×2): 40 ug/kg/min via INTRAVENOUS
  Filled 2020-09-12 (×2): qty 200
  Filled 2020-09-12 (×3): qty 100
  Filled 2020-09-12: qty 200
  Filled 2020-09-12 (×3): qty 100

## 2020-09-12 MED ORDER — LIDOCAINE HCL (PF) 1 % IJ SOLN
INTRAMUSCULAR | Status: AC
  Filled 2020-09-12: qty 30

## 2020-09-12 MED ORDER — LORAZEPAM 2 MG/ML IJ SOLN
INTRAMUSCULAR | Status: AC
  Administered 2020-09-12: 2 mg
  Filled 2020-09-12: qty 1

## 2020-09-12 MED ORDER — ENOXAPARIN SODIUM 30 MG/0.3ML IJ SOSY
30.0000 mg | PREFILLED_SYRINGE | Freq: Two times a day (BID) | INTRAMUSCULAR | Status: DC
  Administered 2020-09-14 – 2020-09-15 (×3): 30 mg via SUBCUTANEOUS
  Filled 2020-09-12 (×3): qty 0.3

## 2020-09-12 MED ORDER — SODIUM CHLORIDE 0.9 % IV SOLN: 2000.0000 mg | Freq: Once | INTRAVENOUS | Status: DC

## 2020-09-12 MED ORDER — POLYETHYLENE GLYCOL 3350 17 G PO PACK
17.0000 g | PACK | Freq: Every day | ORAL | Status: DC
  Administered 2020-09-13 – 2020-09-14 (×2): 17 g
  Filled 2020-09-12 (×2): qty 1

## 2020-09-12 MED ORDER — CEFAZOLIN SODIUM-DEXTROSE 2-4 GM/100ML-% IV SOLN
2.0000 g | Freq: Once | INTRAVENOUS | Status: AC
  Administered 2020-09-12: 2 g via INTRAVENOUS
  Filled 2020-09-12: qty 100

## 2020-09-12 MED ORDER — ONDANSETRON HCL 4 MG/2ML IJ SOLN: 4.0000 mg | Freq: Four times a day (QID) | INTRAMUSCULAR | Status: DC | PRN

## 2020-09-12 MED ORDER — LEVETIRACETAM IN NACL 1000 MG/100ML IV SOLN: 1000.0000 mg | Freq: Once | INTRAVENOUS | Status: AC

## 2020-09-12 MED ORDER — POTASSIUM CHLORIDE 10 MEQ/100ML IV SOLN
10.0000 meq | INTRAVENOUS | Status: AC
Start: 2020-09-12 — End: 2020-09-12
  Administered 2020-09-12: 10 meq via INTRAVENOUS
  Filled 2020-09-12 (×2): qty 100

## 2020-09-12 MED ORDER — TETANUS-DIPHTH-ACELL PERTUSSIS 5-2.5-18.5 LF-MCG/0.5 IM SUSY
0.5000 mL | PREFILLED_SYRINGE | Freq: Once | INTRAMUSCULAR | Status: AC
  Administered 2020-09-12: 0.5 mL via INTRAMUSCULAR
  Filled 2020-09-12: qty 0.5

## 2020-09-12 MED ORDER — INSULIN ASPART 100 UNIT/ML IJ SOLN
0.0000 [IU] | INTRAMUSCULAR | Status: DC
  Administered 2020-09-12 (×2): 2 [IU] via SUBCUTANEOUS
  Administered 2020-09-14: 3 [IU] via SUBCUTANEOUS
  Administered 2020-09-14: 2 [IU] via SUBCUTANEOUS
  Administered 2020-09-14 (×2): 3 [IU] via SUBCUTANEOUS
  Administered 2020-09-15: 2 [IU] via SUBCUTANEOUS

## 2020-09-12 MED ORDER — FENTANYL CITRATE (PF) 100 MCG/2ML IJ SOLN
50.0000 ug | INTRAMUSCULAR | Status: DC | PRN
  Administered 2020-09-12: 50 ug via INTRAVENOUS
  Filled 2020-09-12: qty 2

## 2020-09-12 MED ORDER — LEVETIRACETAM IN NACL 1000 MG/100ML IV SOLN
1000.0000 mg | Freq: Two times a day (BID) | INTRAVENOUS | Status: DC
  Administered 2020-09-12 – 2020-09-15 (×8): 1000 mg via INTRAVENOUS
  Filled 2020-09-12 (×7): qty 100

## 2020-09-12 MED ORDER — MIDAZOLAM 50MG/50ML (1MG/ML) PREMIX INFUSION
5.0000 mg/h | INTRAVENOUS | Status: DC
  Administered 2020-09-12 – 2020-09-13 (×3): 5 mg/h via INTRAVENOUS
  Filled 2020-09-12 (×3): qty 50

## 2020-09-12 MED ORDER — SODIUM CHLORIDE 0.9 % IV SOLN: INTRAVENOUS | Status: DC | PRN

## 2020-09-12 MED ORDER — LIDOCAINE-EPINEPHRINE (PF) 2 %-1:200000 IJ SOLN: 20.0000 mL | Freq: Once | INTRAMUSCULAR | Status: DC

## 2020-09-12 MED ORDER — LORAZEPAM 2 MG/ML IJ SOLN
INTRAMUSCULAR | Status: AC
  Filled 2020-09-12: qty 1

## 2020-09-12 MED ORDER — FENTANYL CITRATE (PF) 100 MCG/2ML IJ SOLN: 50.0000 ug | INTRAMUSCULAR | Status: DC | PRN

## 2020-09-12 MED ORDER — PANTOPRAZOLE SODIUM 40 MG IV SOLR
40.0000 mg | Freq: Every day | INTRAVENOUS | Status: DC
  Administered 2020-09-12 – 2020-09-13 (×2): 40 mg via INTRAVENOUS
  Filled 2020-09-12 (×2): qty 40

## 2020-09-12 MED ORDER — SODIUM CHLORIDE 0.9 % IV SOLN
2500.0000 mg | Freq: Once | INTRAVENOUS | Status: DC
  Filled 2020-09-12: qty 25

## 2020-09-12 MED ORDER — IOHEXOL 300 MG/ML  SOLN
100.0000 mL | Freq: Once | INTRAMUSCULAR | Status: AC | PRN
  Administered 2020-09-12: 100 mL via INTRAVENOUS

## 2020-09-12 MED ORDER — VALPROATE SODIUM 100 MG/ML IV SOLN
2750.0000 mg | Freq: Once | INTRAVENOUS | Status: AC
  Administered 2020-09-12: 2750 mg via INTRAVENOUS
  Filled 2020-09-12: qty 27.5

## 2020-09-12 MED ORDER — SODIUM CHLORIDE 0.9 % IV BOLUS
1000.0000 mL | Freq: Once | INTRAVENOUS | Status: AC
  Administered 2020-09-12: 1000 mL via INTRAVENOUS

## 2020-09-12 MED ORDER — SODIUM CHLORIDE 0.9 % IV SOLN
2000.0000 mg | Freq: Once | INTRAVENOUS | Status: AC
  Administered 2020-09-12: 2000 mg via INTRAVENOUS
  Filled 2020-09-12: qty 20

## 2020-09-12 MED ORDER — CHLORHEXIDINE GLUCONATE 0.12% ORAL RINSE (MEDLINE KIT)
15.0000 mL | Freq: Two times a day (BID) | OROMUCOSAL | Status: DC
  Administered 2020-09-12 – 2020-09-15 (×7): 15 mL via OROMUCOSAL

## 2020-09-12 MED ORDER — LORAZEPAM 2 MG/ML IJ SOLN
2.0000 mg | Freq: Once | INTRAMUSCULAR | Status: AC
  Administered 2020-09-12: 2 mg via INTRAVENOUS

## 2020-09-12 MED ORDER — MAGNESIUM SULFATE 2 GM/50ML IV SOLN
2.0000 g | Freq: Once | INTRAVENOUS | Status: AC
  Administered 2020-09-12: 2 g via INTRAVENOUS
  Filled 2020-09-12: qty 50

## 2020-09-12 MED ORDER — CHLORHEXIDINE GLUCONATE CLOTH 2 % EX PADS
6.0000 | MEDICATED_PAD | Freq: Every day | CUTANEOUS | Status: DC
  Administered 2020-09-13 (×2): 6 via TOPICAL

## 2020-09-12 MED ORDER — LORAZEPAM 2 MG/ML IJ SOLN: 2.0000 mg | Freq: Once | INTRAMUSCULAR | Status: AC

## 2020-09-12 MED ORDER — CALCIUM GLUCONATE-NACL 1-0.675 GM/50ML-% IV SOLN
1.0000 g | Freq: Once | INTRAVENOUS | Status: AC
  Administered 2020-09-12: 1000 mg via INTRAVENOUS
  Filled 2020-09-12: qty 50

## 2020-09-12 MED ORDER — PROPOFOL 500 MG/50ML IV EMUL
INTRAVENOUS | Status: AC | PRN
  Administered 2020-09-12: 720 mg via INTRAVENOUS

## 2020-09-12 MED ORDER — SODIUM CHLORIDE 0.9 % IV SOLN: INTRAVENOUS | Status: DC
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
## 2020-09-12 NOTE — Progress Notes (Signed)
EEG completed, results pending. 
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
## 2020-09-12 NOTE — Procedures (Signed)
Patient Name: Darrell Brooks  MRN: [id number]  Epilepsy Attending: Charlsie Quest  Referring Physician/Provider: Dr Ritta Slot Date: 09/12/2020 Duration: 23.23 mins  Patient history: 35 yo M with recurrent seizures in the post trauma/cardiac arrest setting. EEG to evaluate for seizure  Level of alertness: comatose  AEDs during EEG study: LEV, VPA, propofol  Technical aspects: This EEG study was done with scalp electrodes positioned according to the 10-20 International system of electrode placement. Electrical activity was acquired at a sampling rate of 500Hz  and reviewed with a high frequency filter of 70Hz  and a low frequency filter of 1Hz . EEG data were recorded continuously and digitally stored.   Description: EEG showed 6-10 seconds of generalized eeg suppression alternating with generalized  bursts of highly epileptiform discharges on average lasting 3-4 seconds. Hyperventilation and photic stimulation were not performed.      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 The highly epileptiform bursts are on the ictal-interictal continuum with high potential for seizures. Additionally, there is profound diffuse encephalopathy, likely due to sedation, seizures, anoxic-hypoxic brain injury.   Dr was notified   Darrell Brooks 
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
## 2020-09-12 NOTE — TOC CAGE-AID Note (Signed)
Transition of Care [facility]) - CAGE-AID Screening   Patient Details  Name: Darrell Brooks MRN: [id number] Date of Birth: 09/08/1985  Transition of Care [facility]) CM/SW Contact:    Katha Hamming, RN Phone Number: [phone] 09/12/2020, 1:57 AM   Clinical Narrative:  Unable to participate in screening at this time, intubated and sedated. No hx of substance abuse in other chart.  CAGE-AID Screening: Substance Abuse Screening unable to be completed due to: : Patient unable to participate
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
## 2020-09-12 NOTE — Progress Notes (Signed)
  Echocardiogram 2D Echocardiogram has been attempted. Patient HR 135-155. Too elevated to complete echo. Will reattempt at later time.  Lenae Wherley G Nakaila Freeze 09/12/2020, 8:09 AM
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
## 2020-09-12 NOTE — Progress Notes (Signed)
Pt transported from 4N32 to CT2 and back on the ventilator. RT and RN x2 accompanied pt. VSS throughout. RT will continue to monitor and be available as needed.

## 2020-09-12 NOTE — Progress Notes (Signed)
vLTM EEG started/ notified neuro/ pt monitored

## 2020-09-12 NOTE — Progress Notes (Signed)
RT attempted arterial line placement. Two RT's attempted and unsuccessful. RN aware and will notify MD. RT will continue to monitor and be available as needed.

## 2020-09-12 NOTE — Progress Notes (Signed)
RT note: RT and RN transport vent patient to 4N32. Vital signs stable through out.

## 2020-09-13 ENCOUNTER — Inpatient Hospital Stay (HOSPITAL_COMMUNITY): Payer: No Typology Code available for payment source

## 2020-09-13 DIAGNOSIS — R561 Post traumatic seizures: Secondary | ICD-10-CM

## 2020-09-13 LAB — ECHOCARDIOGRAM COMPLETE
AR max vel: 2.59 cm2
AV Area VTI: 2.97 cm2
AV Area mean vel: 2.54 cm2
AV Mean grad: 3 mmHg
AV Peak grad: 6.7 mmHg
Ao pk vel: 1.29 m/s
Area-P 1/2: 4.21 cm2
Calc EF: 52 %
Height: 72 in
S' Lateral: 4.1 cm
Single Plane A2C EF: 48.7 %
Single Plane A4C EF: 54.9 %
Weight: 2412.71 oz

## 2020-09-13 LAB — CBC
HCT: 37.4 % — ABNORMAL LOW (ref 39.0–52.0)
HCT: 35 % — ABNORMAL LOW (ref 39.0–52.0)
HCT: 35 % — ABNORMAL LOW (ref 39.0–52.0)
Hemoglobin: 12.3 g/dL — ABNORMAL LOW (ref 13.0–17.0)
Hemoglobin: 11.5 g/dL — ABNORMAL LOW (ref 13.0–17.0)
Hemoglobin: 11.7 g/dL — ABNORMAL LOW (ref 13.0–17.0)
MCH: 29.7 pg (ref 26.0–34.0)
MCH: 29.8 pg (ref 26.0–34.0)
MCH: 30.2 pg (ref 26.0–34.0)
MCHC: 32.9 g/dL (ref 30.0–36.0)
MCHC: 32.9 g/dL (ref 30.0–36.0)
MCHC: 33.4 g/dL (ref 30.0–36.0)
MCV: 90.3 fL (ref 80.0–100.0)
MCV: 90.7 fL (ref 80.0–100.0)
MCV: 90.4 fL (ref 80.0–100.0)
Platelets: 163 10*3/uL (ref 150–400)
Platelets: 171 10*3/uL (ref 150–400)
Platelets: 163 10*3/uL (ref 150–400)
RBC: 4.14 MIL/uL — ABNORMAL LOW (ref 4.22–5.81)
RBC: 3.86 MIL/uL — ABNORMAL LOW (ref 4.22–5.81)
RBC: 3.87 MIL/uL — ABNORMAL LOW (ref 4.22–5.81)
RDW: 14.2 % (ref 11.5–15.5)
RDW: 14.5 % (ref 11.5–15.5)
RDW: 14.4 % (ref 11.5–15.5)
WBC: 8.1 10*3/uL (ref 4.0–10.5)
WBC: 9.1 10*3/uL (ref 4.0–10.5)
WBC: 10.6 10*3/uL — ABNORMAL HIGH (ref 4.0–10.5)
nRBC: 0 % (ref 0.0–0.2)
nRBC: 0 % (ref 0.0–0.2)
nRBC: 0 % (ref 0.0–0.2)

## 2020-09-13 LAB — GLUCOSE, CAPILLARY
Glucose-Capillary: 79 mg/dL (ref 70–99)
Glucose-Capillary: 70 mg/dL (ref 70–99)
Glucose-Capillary: 82 mg/dL (ref 70–99)
Glucose-Capillary: 89 mg/dL (ref 70–99)
Glucose-Capillary: 128 mg/dL — ABNORMAL HIGH (ref 70–99)

## 2020-09-13 LAB — COMPREHENSIVE METABOLIC PANEL
ALT: 110 U/L — ABNORMAL HIGH (ref 0–44)
AST: 127 U/L — ABNORMAL HIGH (ref 15–41)
Albumin: 2.9 g/dL — ABNORMAL LOW (ref 3.5–5.0)
Alkaline Phosphatase: 57 U/L (ref 38–126)
Anion gap: 6 (ref 5–15)
BUN: 8 mg/dL (ref 6–20)
CO2: 24 mmol/L (ref 22–32)
Calcium: 8.3 mg/dL — ABNORMAL LOW (ref 8.9–10.3)
Chloride: 110 mmol/L (ref 98–111)
Creatinine, Ser: 1.01 mg/dL (ref 0.61–1.24)
GFR, Estimated: >60 mL/min (ref >60)
Glucose, Bld: 93 mg/dL (ref 70–99)
Potassium: 3.9 mmol/L (ref 3.5–5.1)
Sodium: 140 mmol/L (ref 135–145)
Total Bilirubin: 0.9 mg/dL (ref 0.3–1.2)
Total Protein: 5.9 g/dL — ABNORMAL LOW (ref 6.5–8.1)

## 2020-09-13 LAB — TRIGLYCERIDES: Triglycerides: 86 mg/dL (ref <150)

## 2020-09-13 MED ORDER — GADOBUTROL 1 MMOL/ML IV SOLN
6.5000 mL | Freq: Once | INTRAVENOUS | Status: AC | PRN
  Administered 2020-09-13: 6.5 mL via INTRAVENOUS

## 2020-09-13 MED ORDER — PIVOT 1.5 CAL PO LIQD
1000.0000 mL | ORAL | Status: DC
  Administered 2020-09-13 – 2020-09-14 (×2): 1000 mL

## 2020-09-13 MED ORDER — OXYCODONE HCL 5 MG/5ML PO SOLN
5.0000 mg | ORAL | Status: DC | PRN
  Administered 2020-09-13: 5 mg
  Administered 2020-09-14 (×2): 10 mg
  Filled 2020-09-13: qty 5
  Filled 2020-09-13 (×2): qty 10

## 2020-09-13 MED ORDER — VITAL HIGH PROTEIN PO LIQD: 1000.0000 mL | ORAL | Status: DC

## 2020-09-13 MED ORDER — PROSOURCE TF PO LIQD
45.0000 mL | Freq: Two times a day (BID) | ORAL | Status: DC
  Administered 2020-09-13: 45 mL
  Filled 2020-09-13: qty 45

## 2020-09-13 MED ORDER — ACETAMINOPHEN 500 MG PO TABS
1000.0000 mg | ORAL_TABLET | Freq: Four times a day (QID) | ORAL | Status: DC
  Administered 2020-09-13 – 2020-09-15 (×8): 1000 mg
  Filled 2020-09-13 (×8): qty 2

## 2020-09-13 MED ORDER — FENTANYL 2500MCG IN NS 250ML (10MCG/ML) PREMIX INFUSION
50.0000 ug/h | INTRAVENOUS | Status: DC
  Administered 2020-09-13: 25 ug/h via INTRAVENOUS
  Filled 2020-09-13: qty 250

## 2020-09-13 MED ORDER — METHOCARBAMOL 500 MG PO TABS
1000.0000 mg | ORAL_TABLET | Freq: Three times a day (TID) | ORAL | Status: DC
  Administered 2020-09-13 – 2020-09-15 (×6): 1000 mg
  Filled 2020-09-13 (×6): qty 2

## 2020-09-13 NOTE — Progress Notes (Signed)
LTM EEG discontinued - no skin breakdown at unhook.   

## 2020-09-13 NOTE — Progress Notes (Signed)
*  PRELIMINARY RESULTS* Echocardiogram 2D Echocardiogram has been performed.  Neomia Dear RDCS 09/13/2020, 8:33 AM

## 2020-09-13 NOTE — Progress Notes (Signed)
[place] notified for referral:  [phone]

## 2020-09-13 NOTE — Progress Notes (Signed)
Clinical update attempted after results of MRI received. Called patient's wife [place] at number listed in chart three times. No answer and VM not set up, so no ability to leave VM. Per nurse, family dynamics are complex. Other family members at bedside currently, but will defer clinical update until primary decision-maker can be reached. MRI reviewed with Neurology and NSGY, both prognosticate essentially no meaningful functional outcome and per Neurology, expect that patient will progress to brain death regardless of interventions performed. Will discuss this with patient's wife when able to reach.   Diamantina Monks, MD General and Trauma Surgery [facility]

## 2020-09-13 NOTE — Progress Notes (Signed)
LTM EEG reviewed. Suppressed periods on EEG are too long. Propofol currently at a rate of 80. Decreasing propofol infusion rate to 40 mcg/kg/min.   Will continue to monitor EEG.   Electronically signed: Dr. Caryl Pina

## 2020-09-13 NOTE — Progress Notes (Signed)
Subjective: Epileptiform bursts improved overnight.  Patient's mother and sister at bedside.  ROS: Unable to obtain due to poor mental status  Examination  Vital signs in last 24 hours: Temp:  [97.16 F (36.2 C)-101.5 F (38.6 C)] 101.5 F (38.6 C) (07/18 1200) Pulse Rate:  [77-103] 100 (07/18 1200) Resp:  [14-16] 16 (07/18 1200) BP: (97-136)/(62-85) 134/85 (07/18 1200) SpO2:  [98 %-100 %] 100 % (07/18 1200) FiO2 (%):  [30 %] 30 % (07/18 1110)  General: lying in bed, not in apparent distress CVS: pulse-normal rate and rhythm RS: Intubated, coarse breath sounds bilaterally Extremities: normal, warm Neuro: On propofol at 40 mcg/hr, comatose, does not open eyes noxious stimuli, pupils equally round and reactive to light, corneal reflex absent, gag reflex absent, flaccid, does not withdraw to noxious stimuli in upper extremities  Basic Metabolic Panel: Recent Labs  Lab 09/12/20 0005 09/12/20 0026 09/12/20 0052 09/12/20 0337 09/12/20 0856 09/13/20 0814  NA 135 138 139 137 137 140  K 2.9* 2.9* 3.2* 3.7 4.1 3.9  CL 104 105  --  102 108 110  CO2 14*  --   --  21* 19* 24  GLUCOSE 272* 266*  --  135* 126* 93  BUN 17 19  --  14 11 8   CREATININE 1.58* 1.70*  --  1.22 0.99 1.01  CALCIUM 8.1*  --   --  7.9* 8.0* 8.3*  MG  --   --   --   --  1.7  --     CBC: Recent Labs  Lab 09/12/20 0337 09/12/20 0856 09/12/20 1308 09/12/20 2058 09/13/20 0814  WBC 12.4* 11.3* 9.8 8.7 8.1  HGB 15.4 14.1 13.2 11.6* 12.3*  HCT 45.8 41.8 38.9* 34.4* 37.4*  MCV 88.8 89.1 88.6 89.1 90.3  PLT 225 228 205 179 163     Coagulation Studies: Recent Labs    09/12/20 0005  LABPROT 14.0  INR 1.1    Imaging  CT head without contrast 09/12/2020: Hemorrhage in the sphenoid sinuses, but no associated skull base or skull fracture is identified. No pneumocephalus, intracranial hemorrhage, or acute traumatic injury to the brain identified. Previous right side craniotomy.  ASSESSMENT AND PLAN:  35 year old male with seizures in the setting of postop/cardiac arrest  Status epilepticus (resolved) Acute encephalopathy, due to trauma, seizures, medications -LTM EEG initially showed burst suppression with highly epileptiform discharges with gradually improved to generalized slowing  Recommendation -We will turn off Versed as epileptiform bursts have improved -Continue propofol at current dose of 47mcg/hr -We will obtain MRI brain without contrast to look for extent of brain injury -Depending on MRI results, will discuss goals of care with family and consider restarting LTM EEG -Continue Keppra 1000 mg twice daily -Patient's EEG initially showed burst suppression with highly epileptiform discharges which has since improved.,  On exam, patient has equally reactive pupils but no other cranial nerve reflexes and does not withdraw to noxious stimulation.  Discussed with family that we will obtain MRI brain and depending on the findings we will discuss further steps with family.  Of note, patient is married but is currently separated from his wife and is in process of getting a divorce. -Management of rest of comorbidities per primary team  CRITICAL CARE Performed by: 43m   Total critical care time: 35 minutes  Critical care time was exclusive of separately billable procedures and treating other patients.  Critical care was necessary to treat or prevent imminent or life-threatening deterioration.  Critical  care was time spent personally by me on the following activities: development of treatment plan with patient and/or surrogate as well as nursing, discussions with consultants, evaluation of patient's response to treatment, examination of patient, obtaining history from patient or surrogate, ordering and performing treatments and interventions, ordering and review of laboratory studies, ordering and review of radiographic studies, pulse oximetry and re-evaluation of patient's  condition.  Lindie Spruce Epilepsy [facility] For questions after 5pm please refer to AMION to reach the Neurologist on call
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
## 2020-09-13 NOTE — Progress Notes (Signed)
Pt. was transported to MRI via ventilator with no complications. RT X 1, RN X 2. RT will continue to monitor

## 2020-09-13 NOTE — Progress Notes (Signed)
LTM maintenance completed, no skin breakdown was seen.
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
## 2020-09-13 NOTE — Procedures (Signed)
Patient Name: Darrell Brooks  MRN: [id number]  Epilepsy Attending: Charlsie Quest  Referring Physician/Provider: Dr Ritta Slot Duration: 09/12/2020 0957 to 09/13/2020 1048   Patient history: 35 yo M with recurrent seizures in the post trauma/cardiac arrest setting. EEG to evaluate for seizure   Level of alertness: comatose   AEDs during EEG study: LEV, VPA, propofol, versed   Technical aspects: This EEG study was done with scalp electrodes positioned according to the 10-20 International system of electrode placement. Electrical activity was acquired at a sampling rate of 500Hz  and reviewed with a high frequency filter of 70Hz  and a low frequency filter of 1Hz . EEG data were recorded continuously and digitally stored.   Description: EEG initially showed 6-10 seconds of generalized eeg suppression alternating with generalized  bursts of highly epileptiform discharges on average lasting 3-4 seconds. As sedation was titrated, periods of suppression lasted 5-10 seconds. After around 1730 on 09/12/2020, EEG showed generalized suppression lasting average 30 seconds and sharply contoured generalized bursts lasting 0.5 -1 seconds. After around 0200 on 09/13/2020, propofol was reduced and showed low amplitude 2-3Hz  delta slowing admixed with generalized sharp waves every few seconds. Hyperventilation and photic stimulation were not performed.      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 The highly epileptiform bursts were on the ictal-interictal continuum with high potential for seizures. Additionally, there was profound diffuse encephalopathy, likely due to sedation, seizures, anoxic-hypoxic brain injury. As sedation was adjusted, epileptiform discharges resolved and EEG was suggestive of severe diffuse encephalopathy, likely due to  sedation.    [name] 

## 2020-09-14 ENCOUNTER — Inpatient Hospital Stay (HOSPITAL_COMMUNITY): Payer: No Typology Code available for payment source

## 2020-09-14 LAB — COMPREHENSIVE METABOLIC PANEL
ALT: 72 U/L — ABNORMAL HIGH (ref 0–44)
AST: 113 U/L — ABNORMAL HIGH (ref 15–41)
Albumin: 2.5 g/dL — ABNORMAL LOW (ref 3.5–5.0)
Alkaline Phosphatase: 51 U/L (ref 38–126)
Anion gap: 3 — ABNORMAL LOW (ref 5–15)
BUN: 9 mg/dL (ref 6–20)
CO2: 24 mmol/L (ref 22–32)
Calcium: 8.1 mg/dL — ABNORMAL LOW (ref 8.9–10.3)
Chloride: 111 mmol/L (ref 98–111)
Creatinine, Ser: 0.86 mg/dL (ref 0.61–1.24)
GFR, Estimated: >60 mL/min (ref >60)
Glucose, Bld: 113 mg/dL — ABNORMAL HIGH (ref 70–99)
Potassium: 3.8 mmol/L (ref 3.5–5.1)
Sodium: 138 mmol/L (ref 135–145)
Total Bilirubin: 0.7 mg/dL (ref 0.3–1.2)
Total Protein: 5.6 g/dL — ABNORMAL LOW (ref 6.5–8.1)

## 2020-09-14 LAB — CBC
HCT: 32.5 % — ABNORMAL LOW (ref 39.0–52.0)
HCT: 35.2 % — ABNORMAL LOW (ref 39.0–52.0)
HCT: 33.9 % — ABNORMAL LOW (ref 39.0–52.0)
Hemoglobin: 10.9 g/dL — ABNORMAL LOW (ref 13.0–17.0)
Hemoglobin: 11.6 g/dL — ABNORMAL LOW (ref 13.0–17.0)
Hemoglobin: 11.1 g/dL — ABNORMAL LOW (ref 13.0–17.0)
MCH: 30.4 pg (ref 26.0–34.0)
MCH: 29.9 pg (ref 26.0–34.0)
MCH: 30 pg (ref 26.0–34.0)
MCHC: 33.5 g/dL (ref 30.0–36.0)
MCHC: 33 g/dL (ref 30.0–36.0)
MCHC: 32.7 g/dL (ref 30.0–36.0)
MCV: 90.8 fL (ref 80.0–100.0)
MCV: 90.7 fL (ref 80.0–100.0)
MCV: 91.6 fL (ref 80.0–100.0)
Platelets: 156 10*3/uL (ref 150–400)
Platelets: 153 10*3/uL (ref 150–400)
Platelets: 157 10*3/uL (ref 150–400)
RBC: 3.58 MIL/uL — ABNORMAL LOW (ref 4.22–5.81)
RBC: 3.88 MIL/uL — ABNORMAL LOW (ref 4.22–5.81)
RBC: 3.7 MIL/uL — ABNORMAL LOW (ref 4.22–5.81)
RDW: 14.2 % (ref 11.5–15.5)
RDW: 14.4 % (ref 11.5–15.5)
RDW: 14.4 % (ref 11.5–15.5)
WBC: 9.2 10*3/uL (ref 4.0–10.5)
WBC: 10.4 10*3/uL (ref 4.0–10.5)
WBC: 8.7 10*3/uL (ref 4.0–10.5)
nRBC: 0 % (ref 0.0–0.2)
nRBC: 0 % (ref 0.0–0.2)
nRBC: 0 % (ref 0.0–0.2)

## 2020-09-14 LAB — GLUCOSE, CAPILLARY
Glucose-Capillary: 111 mg/dL — ABNORMAL HIGH (ref 70–99)
Glucose-Capillary: 127 mg/dL — ABNORMAL HIGH (ref 70–99)
Glucose-Capillary: 142 mg/dL — ABNORMAL HIGH (ref 70–99)
Glucose-Capillary: 152 mg/dL — ABNORMAL HIGH (ref 70–99)
Glucose-Capillary: 155 mg/dL — ABNORMAL HIGH (ref 70–99)
Glucose-Capillary: 156 mg/dL — ABNORMAL HIGH (ref 70–99)
Glucose-Capillary: 94 mg/dL (ref 70–99)

## 2020-09-14 MED ORDER — CHLORHEXIDINE GLUCONATE CLOTH 2 % EX PADS
6.0000 | MEDICATED_PAD | Freq: Every day | CUTANEOUS | Status: DC
  Administered 2020-09-15: 6 via TOPICAL

## 2020-09-14 MED ORDER — PANTOPRAZOLE SODIUM 40 MG PO PACK
40.0000 mg | PACK | Freq: Every day | ORAL | Status: DC
  Administered 2020-09-14 – 2020-09-15 (×2): 40 mg
  Filled 2020-09-14 (×2): qty 20

## 2020-09-14 NOTE — Progress Notes (Signed)
Unfortunately overnight his pupillary exam change.  Bilateral pupils are fixed and dilated.  Imaging is consistent with anoxic brain injury diffuse cerebral edema.  His examination remains poor.  Intubated, pupils fixed at 6 mm.  No corneal, no gag reflexes.  No motor response.    TBI with anoxic injury   We will sign off at this time.  Please call with questions or concerns    Monia Pouch, DO Neurosurgeon

## 2020-09-14 NOTE — Progress Notes (Signed)
Dr. Derry Lory neurology notified of neuro change. He reviewed the MRI from this evening as well as Stat head CT. No new orders at this time. RN will continue to monitor patient

## 2020-09-14 NOTE — Progress Notes (Signed)
Patient's mother and older daughter came to room after quiet time at 1400. Daughter (who was not the designated visitor for today) was very agitated and argumentative stating that she wanted to see the case manager in person and asked why we were not providing this face to face meeting. I called Darrell Brooks, case manager, to discuss and was told she met with the mother and this same sister yesterday and went over all of their concerns at length at that time. Patient's mother and sisters continue to be in distress over the fact that the patient's wife, Darrell Brooks, would be the final decision maker because they are still legally married.  Emotional support was given to the patient's mother. She asked to have the physician go over prognosis, legal decision making, etc tomorrow.  If physician is not spanish speaking, a translator should be present. Darrell Brooks C 5:23 PM

## 2020-09-14 NOTE — Progress Notes (Signed)
   09/14/20 0030  Provider Notification  Provider Name/Title Leo Grosser NP  Date Provider Notified 09/14/20  Time Provider Notified 0030  Notification Type Page  Notification Reason Change in status (Pupils 5 Nonreactive)  Date Critical Result Received 09/14/20  Time Critical Result Received 0030  Provider response See new orders (Stat Head Ct)  Date of Provider Response 09/14/20  Time of Provider Response 0030

## 2020-09-14 NOTE — TOC Progression Note (Signed)
Transition of Care [facility]) - Progression Note    Patient Details  Name: Darrell Brooks MRN: [id number] Date of Birth: [birth year]  Transition of Care [facility]) CM/SW Contact  Glennon Mac, RN Phone Number: 09/14/2020, 4:37pm  Clinical Narrative:   Notified by bedside nurse, Junious Dresser that patient's mother and sister are requesting to speak with social worker.  They are again upset that patient's wife is the decision maker.  In [place], if a patient is incapacitated, decision making automatically goes to patient's spouse or significant other.  I understand that patient is separated from his wife, but they are still technically legally married until the divorce is complete. Next in line for decision-making would be patient's mother.  I do understand that family is upset with the wife, but I do not think they have any recourse.  Will attempt to reach out to hospital attorney in a.m. to verify that there are no exceptions to this rule regarding couples that are separated.      Barriers to Discharge: Continued Medical Work up  Expected Discharge Plan and Services     Discharge Planning Services: CM Consult   Living arrangements for the past 2 months: Single Family Home                                       Social Determinants of Health (SDOH) Interventions    Readmission Risk Interventions No flowsheet data found.  Quintella Baton, RN, BSN  Trauma/Neuro ICU Case Manager [phone]

## 2020-09-14 NOTE — Progress Notes (Signed)
Patient ID: Darrell Brooks, male   DOB: [birth year], 35 y.o.   MRN: [id number] Re-examined. He still breathes above the vent with stimulation. I updated his wife and her sister.  Violeta Gelinas, MD, MPH, FACS Please use [email or web address] to contact on call provider

## 2020-09-14 NOTE — Progress Notes (Signed)
RT flipped patient to wean.  PS/CPAP 10/5 for a min with back up rate of 15.  Patient apneic.  No patient effort.  RN at bedside.  Patient put back on full support with previous vent settings.  RT will continue to monitor.

## 2020-09-14 NOTE — Progress Notes (Signed)
Patient transported on ventilator from 4N32 to CT and back without complications.

## 2020-09-14 NOTE — Progress Notes (Addendum)
TRN called to bedside by primary RN, pupils blown 19mm nonreactive. Neurosurgery and trauma made aware by primary RN. No reflexes intact upon assessment, no response to painful stimuli. Patient down to CT, pending results.

## 2020-09-14 NOTE — Progress Notes (Signed)
Called in regards to this patients MRI and pupillary change. Per nurse, pupils blew tonight, neuro exam unchanged. MRI consistent with anoxic brain injury, diffuse cerebral edema and mass effect. CT has loss of gray white mater differentiation also consistent with anoxic brain injury. Nothing surgical to offer at this point. Would move forward with brain death testing.
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
## 2020-09-15 ENCOUNTER — Inpatient Hospital Stay (HOSPITAL_COMMUNITY): Payer: No Typology Code available for payment source

## 2020-09-15 ENCOUNTER — Encounter (HOSPITAL_COMMUNITY): Payer: Self-pay

## 2020-09-15 ENCOUNTER — Other Ambulatory Visit (HOSPITAL_COMMUNITY): Payer: No Typology Code available for payment source

## 2020-09-15 DIAGNOSIS — S36039A Unspecified laceration of spleen, initial encounter: Secondary | ICD-10-CM | POA: Diagnosis present

## 2020-09-15 DIAGNOSIS — R6521 Severe sepsis with septic shock: Secondary | ICD-10-CM

## 2020-09-15 DIAGNOSIS — G931 Anoxic brain damage, not elsewhere classified: Secondary | ICD-10-CM

## 2020-09-15 DIAGNOSIS — A419 Sepsis, unspecified organism: Secondary | ICD-10-CM

## 2020-09-15 DIAGNOSIS — S14109A Unspecified injury at unspecified level of cervical spinal cord, initial encounter: Secondary | ICD-10-CM

## 2020-09-15 DIAGNOSIS — J939 Pneumothorax, unspecified: Secondary | ICD-10-CM | POA: Diagnosis present

## 2020-09-15 LAB — CBC
HCT: 32.1 % — ABNORMAL LOW (ref 39.0–52.0)
Hemoglobin: 10.6 g/dL — ABNORMAL LOW (ref 13.0–17.0)
MCH: 30.1 pg (ref 26.0–34.0)
MCHC: 33 g/dL (ref 30.0–36.0)
MCV: 91.2 fL (ref 80.0–100.0)
Platelets: 164 10*3/uL (ref 150–400)
RBC: 3.52 MIL/uL — ABNORMAL LOW (ref 4.22–5.81)
RDW: 14.3 % (ref 11.5–15.5)
WBC: 8.3 10*3/uL (ref 4.0–10.5)
nRBC: 0 % (ref 0.0–0.2)

## 2020-09-15 LAB — BASIC METABOLIC PANEL
Anion gap: 3 — ABNORMAL LOW (ref 5–15)
BUN: 11 mg/dL (ref 6–20)
CO2: 23 mmol/L (ref 22–32)
Calcium: 8.3 mg/dL — ABNORMAL LOW (ref 8.9–10.3)
Chloride: 113 mmol/L — ABNORMAL HIGH (ref 98–111)
Creatinine, Ser: 0.74 mg/dL (ref 0.61–1.24)
GFR, Estimated: >60 mL/min (ref >60)
Glucose, Bld: 156 mg/dL — ABNORMAL HIGH (ref 70–99)
Potassium: 4.3 mmol/L (ref 3.5–5.1)
Sodium: 139 mmol/L (ref 135–145)

## 2020-09-15 LAB — GLUCOSE, CAPILLARY
Glucose-Capillary: 115 mg/dL — ABNORMAL HIGH (ref 70–99)
Glucose-Capillary: 82 mg/dL (ref 70–99)
Glucose-Capillary: 99 mg/dL (ref 70–99)
Glucose-Capillary: 119 mg/dL — ABNORMAL HIGH (ref 70–99)

## 2020-09-15 LAB — AMYLASE: Amylase: 154 U/L — ABNORMAL HIGH (ref 28–100)

## 2020-09-15 LAB — CK: Total CK: 253 U/L (ref 49–397)

## 2020-09-15 LAB — PHOSPHORUS: Phosphorus: 2.1 mg/dL — ABNORMAL LOW (ref 2.5–4.6)

## 2020-09-15 LAB — LIPASE, BLOOD: Lipase: 62 U/L — ABNORMAL HIGH (ref 11–51)

## 2020-09-15 LAB — APTT: aPTT: 35 seconds (ref 24–36)

## 2020-09-15 LAB — PROTIME-INR
INR: 1.2 (ref 0.8–1.2)
Prothrombin Time: 15.3 seconds — ABNORMAL HIGH (ref 11.4–15.2)

## 2020-09-15 LAB — LACTIC ACID, PLASMA: Lactic Acid, Venous: 1.8 mmol/L (ref 0.5–1.9)

## 2020-09-15 LAB — TROPONIN I (HIGH SENSITIVITY): Troponin I (High Sensitivity): 11 ng/L (ref <18)

## 2020-09-15 LAB — BILIRUBIN, DIRECT: Bilirubin, Direct: 0.2 mg/dL (ref 0.0–0.2)

## 2020-09-15 LAB — MAGNESIUM: Magnesium: 2.1 mg/dL (ref 1.7–2.4)

## 2020-09-15 MED ORDER — VASOPRESSIN 20 UNITS/100 ML INFUSION FOR SHOCK
0.0150 [IU]/min | INTRAVENOUS | Status: DC
  Administered 2020-09-15: 0.1 [IU]/min via INTRAVENOUS
  Filled 2020-09-15: qty 100

## 2020-09-15 MED ORDER — FLUCONAZOLE IN SODIUM CHLORIDE 400-0.9 MG/200ML-% IV SOLN
400.0000 mg | INTRAVENOUS | Status: DC
  Administered 2020-09-15 – 2020-09-17 (×2): 400 mg via INTRAVENOUS
  Filled 2020-09-15 (×3): qty 200

## 2020-09-15 MED ORDER — VASOPRESSIN 20 UNITS/100 ML INFUSION FOR SHOCK
0.5000 [IU]/min | INTRAVENOUS | Status: DC
  Administered 2020-09-15: 0.5 [IU]/min via INTRAVENOUS
  Administered 2020-09-16 (×2): 0.05 [IU]/min via INTRAVENOUS
  Filled 2020-09-15 (×4): qty 100

## 2020-09-15 MED ORDER — SODIUM CHLORIDE 0.45 % IV SOLN: INTRAVENOUS | Status: DC

## 2020-09-15 MED ORDER — SODIUM CHLORIDE 0.9 % IV BOLUS
500.0000 mL | Freq: Once | INTRAVENOUS | Status: AC
  Administered 2020-09-15: 500 mL via INTRAVENOUS

## 2020-09-15 MED ORDER — SODIUM CHLORIDE 0.9 % IV SOLN
1000.0000 mg | Freq: Once | INTRAVENOUS | Status: AC
  Administered 2020-09-15: 1000 mg via INTRAVENOUS
  Filled 2020-09-15: qty 8

## 2020-09-15 MED ORDER — VANCOMYCIN HCL 1000 MG IV SOLR
1000.0000 mg | Freq: Once | INTRAVENOUS | Status: AC
  Administered 2020-09-15: 1000 mg via INTRAVENOUS
  Filled 2020-09-15: qty 1000

## 2020-09-15 MED ORDER — VANCOMYCIN HCL 1000 MG IV SOLR
INTRAVENOUS | Status: AC
  Filled 2020-09-15: qty 1000

## 2020-09-15 MED ORDER — LACTATED RINGERS IV BOLUS
1000.0000 mL | INTRAVENOUS | Status: AC
Start: 2020-09-15 — End: 2020-09-15
  Administered 2020-09-15 (×3): 1000 mL via INTRAVENOUS

## 2020-09-15 MED ORDER — NOREPINEPHRINE 4 MG/250ML-% IV SOLN
0.0000 ug/min | INTRAVENOUS | Status: DC
  Administered 2020-09-15: 2 ug/min via INTRAVENOUS
  Administered 2020-09-15: 5 ug/min via INTRAVENOUS
  Administered 2020-09-16: 9 ug/min via INTRAVENOUS
  Administered 2020-09-16: 7 ug/min via INTRAVENOUS
  Filled 2020-09-15 (×4): qty 250

## 2020-09-15 MED ORDER — ALBUMIN HUMAN 5 % IV SOLN
25.0000 g | Freq: Once | INTRAVENOUS | Status: AC
  Administered 2020-09-15: 25 g via INTRAVENOUS
  Filled 2020-09-15: qty 500

## 2020-09-15 MED ORDER — TECHNETIUM TC 99M EXAMETAZIME IV KIT
20.2000 | PACK | Freq: Once | INTRAVENOUS | Status: AC | PRN
  Administered 2020-09-15: 20.2 via INTRAVENOUS

## 2020-09-15 MED ORDER — CIPROFLOXACIN IN D5W 400 MG/200ML IV SOLN
400.0000 mg | Freq: Two times a day (BID) | INTRAVENOUS | Status: DC
  Administered 2020-09-15 – 2020-09-17 (×4): 400 mg via INTRAVENOUS
  Filled 2020-09-15 (×7): qty 200

## 2020-09-16 ENCOUNTER — Other Ambulatory Visit (HOSPITAL_COMMUNITY): Payer: No Typology Code available for payment source

## 2020-09-16 LAB — POCT I-STAT 7, (LYTES, BLD GAS, ICA,H+H)
Acid-Base Excess: 0 mmol/L (ref 0.0–2.0)
Acid-Base Excess: 5 mmol/L — ABNORMAL HIGH (ref 0.0–2.0)
Acid-Base Excess: 4 mmol/L — ABNORMAL HIGH (ref 0.0–2.0)
Acid-base deficit: 2 mmol/L (ref 0.0–2.0)
Acid-base deficit: 5 mmol/L — ABNORMAL HIGH (ref 0.0–2.0)
Acid-base deficit: 7 mmol/L — ABNORMAL HIGH (ref 0.0–2.0)
Bicarbonate: 18.5 mmol/L — ABNORMAL LOW (ref 20.0–28.0)
Bicarbonate: 20.9 mmol/L (ref 20.0–28.0)
Bicarbonate: 23.4 mmol/L (ref 20.0–28.0)
Bicarbonate: 25.7 mmol/L (ref 20.0–28.0)
Bicarbonate: 27.8 mmol/L (ref 20.0–28.0)
Bicarbonate: 28.9 mmol/L — ABNORMAL HIGH (ref 20.0–28.0)
Calcium, Ion: 1.25 mmol/L (ref 1.15–1.40)
Calcium, Ion: 1.4 mmol/L (ref 1.15–1.40)
Calcium, Ion: 1.4 mmol/L (ref 1.15–1.40)
Calcium, Ion: 1.49 mmol/L — ABNORMAL HIGH (ref 1.15–1.40)
Calcium, Ion: 1.56 mmol/L (ref 1.15–1.40)
Calcium, Ion: 1.6 mmol/L (ref 1.15–1.40)
HCT: 24 % — ABNORMAL LOW (ref 39.0–52.0)
HCT: 28 % — ABNORMAL LOW (ref 39.0–52.0)
HCT: 30 % — ABNORMAL LOW (ref 39.0–52.0)
HCT: 30 % — ABNORMAL LOW (ref 39.0–52.0)
HCT: 31 % — ABNORMAL LOW (ref 39.0–52.0)
HCT: 32 % — ABNORMAL LOW (ref 39.0–52.0)
Hemoglobin: 10.2 g/dL — ABNORMAL LOW (ref 13.0–17.0)
Hemoglobin: 10.2 g/dL — ABNORMAL LOW (ref 13.0–17.0)
Hemoglobin: 10.5 g/dL — ABNORMAL LOW (ref 13.0–17.0)
Hemoglobin: 10.9 g/dL — ABNORMAL LOW (ref 13.0–17.0)
Hemoglobin: 8.2 g/dL — ABNORMAL LOW (ref 13.0–17.0)
Hemoglobin: 9.5 g/dL — ABNORMAL LOW (ref 13.0–17.0)
O2 Saturation: 100 %
O2 Saturation: 100 %
O2 Saturation: 92 %
O2 Saturation: 98 %
O2 Saturation: 99 %
O2 Saturation: 99 %
Patient temperature: 103.2
Patient temperature: 98
Patient temperature: 98.1
Patient temperature: 98.1
Patient temperature: 98.3
Patient temperature: 98.5
Potassium: 2.3 mmol/L — CL (ref 3.5–5.1)
Potassium: <2 mmol/L — CL (ref 3.5–5.1)
Potassium: <2 mmol/L — CL (ref 3.5–5.1)
Potassium: <2 mmol/L — CL (ref 3.5–5.1)
Potassium: <2 mmol/L — CL (ref 3.5–5.1)
Potassium: <2 mmol/L — CL (ref 3.5–5.1)
Sodium: 174 mmol/L (ref 135–145)
Sodium: 177 mmol/L (ref 135–145)
Sodium: 178 mmol/L (ref 135–145)
Sodium: 178 mmol/L (ref 135–145)
Sodium: 179 mmol/L (ref 135–145)
Sodium: 180 mmol/L (ref 135–145)
TCO2: 20 mmol/L — ABNORMAL LOW (ref 22–32)
TCO2: 22 mmol/L (ref 22–32)
TCO2: 25 mmol/L (ref 22–32)
TCO2: 27 mmol/L (ref 22–32)
TCO2: 29 mmol/L (ref 22–32)
TCO2: 30 mmol/L (ref 22–32)
pCO2 arterial: 35.8 mmHg (ref 32.0–48.0)
pCO2 arterial: 37 mmHg (ref 32.0–48.0)
pCO2 arterial: 39.7 mmHg (ref 32.0–48.0)
pCO2 arterial: 40.1 mmHg (ref 32.0–48.0)
pCO2 arterial: 41.3 mmHg (ref 32.0–48.0)
pCO2 arterial: 48.4 mmHg — ABNORMAL HIGH (ref 32.0–48.0)
pH, Arterial: 7.306 — ABNORMAL LOW (ref 7.350–7.450)
pH, Arterial: 7.329 — ABNORMAL LOW (ref 7.350–7.450)
pH, Arterial: 7.344 — ABNORMAL LOW (ref 7.350–7.450)
pH, Arterial: 7.362 (ref 7.350–7.450)
pH, Arterial: 7.464 — ABNORMAL HIGH (ref 7.350–7.450)
pH, Arterial: 7.497 — ABNORMAL HIGH (ref 7.350–7.450)
pO2, Arterial: 131 mmHg — ABNORMAL HIGH (ref 83.0–108.0)
pO2, Arterial: 134 mmHg — ABNORMAL HIGH (ref 83.0–108.0)
pO2, Arterial: 143 mmHg — ABNORMAL HIGH (ref 83.0–108.0)
pO2, Arterial: 414 mmHg — ABNORMAL HIGH (ref 83.0–108.0)
pO2, Arterial: 424 mmHg — ABNORMAL HIGH (ref 83.0–108.0)
pO2, Arterial: 68 mmHg — ABNORMAL LOW (ref 83.0–108.0)

## 2020-09-16 LAB — BLOOD CULTURE ID PANEL (REFLEXED) - BCID2

## 2020-09-16 LAB — CBC WITH DIFFERENTIAL/PLATELET
Abs Immature Granulocytes: 0.02 10*3/uL (ref 0.00–0.07)
Abs Immature Granulocytes: 0 10*3/uL (ref 0.00–0.07)
Basophils Absolute: 0 10*3/uL (ref 0.0–0.1)
Basophils Absolute: 0 10*3/uL (ref 0.0–0.1)
Basophils Relative: 1 %
Basophils Relative: 0 %
Eosinophils Absolute: 0.1 10*3/uL (ref 0.0–0.5)
Eosinophils Absolute: 0 10*3/uL (ref 0.0–0.5)
Eosinophils Relative: 2 %
Eosinophils Relative: 0 %
HCT: 37 % — ABNORMAL LOW (ref 39.0–52.0)
HCT: 28.2 % — ABNORMAL LOW (ref 39.0–52.0)
Hemoglobin: 11.9 g/dL — ABNORMAL LOW (ref 13.0–17.0)
Hemoglobin: 9.2 g/dL — ABNORMAL LOW (ref 13.0–17.0)
Immature Granulocytes: 0 %
Lymphocytes Relative: 10 %
Lymphocytes Relative: 12 %
Lymphs Abs: 0.5 10*3/uL — ABNORMAL LOW (ref 0.7–4.0)
Lymphs Abs: 1 10*3/uL (ref 0.7–4.0)
MCH: 29.6 pg (ref 26.0–34.0)
MCH: 29.4 pg (ref 26.0–34.0)
MCHC: 32.2 g/dL (ref 30.0–36.0)
MCHC: 32.6 g/dL (ref 30.0–36.0)
MCV: 92 fL (ref 80.0–100.0)
MCV: 90.1 fL (ref 80.0–100.0)
Monocytes Absolute: 0.4 10*3/uL (ref 0.1–1.0)
Monocytes Absolute: 0 10*3/uL — ABNORMAL LOW (ref 0.1–1.0)
Monocytes Relative: 8 %
Monocytes Relative: 0 %
Neutro Abs: 4.3 10*3/uL (ref 1.7–7.7)
Neutro Abs: 7.2 10*3/uL (ref 1.7–7.7)
Neutrophils Relative %: 79 %
Neutrophils Relative %: 88 %
Platelets: 237 10*3/uL (ref 150–400)
Platelets: 218 10*3/uL (ref 150–400)
RBC: 4.02 MIL/uL — ABNORMAL LOW (ref 4.22–5.81)
RBC: 3.13 MIL/uL — ABNORMAL LOW (ref 4.22–5.81)
RDW: 14.7 % (ref 11.5–15.5)
RDW: 14.7 % (ref 11.5–15.5)
WBC: 5.4 10*3/uL (ref 4.0–10.5)
WBC: 8.2 10*3/uL (ref 4.0–10.5)
nRBC: 0 % (ref 0.0–0.2)
nRBC: 0 % (ref 0.0–0.2)
nRBC: 0 /100 WBC

## 2020-09-16 LAB — CK TOTAL AND CKMB (NOT AT ARMC)
CK, MB: 5.4 ng/mL — ABNORMAL HIGH (ref 0.5–5.0)
Relative Index: 3.8 — ABNORMAL HIGH (ref 0.0–2.5)
Total CK: 141 U/L (ref 49–397)

## 2020-09-16 LAB — COMPREHENSIVE METABOLIC PANEL
ALT: 56 U/L — ABNORMAL HIGH (ref 0–44)
ALT: 50 U/L — ABNORMAL HIGH (ref 0–44)
ALT: 65 U/L — ABNORMAL HIGH (ref 0–44)
AST: 43 U/L — ABNORMAL HIGH (ref 15–41)
AST: 39 U/L (ref 15–41)
AST: 69 U/L — ABNORMAL HIGH (ref 15–41)
Albumin: 2.7 g/dL — ABNORMAL LOW (ref 3.5–5.0)
Albumin: 2.5 g/dL — ABNORMAL LOW (ref 3.5–5.0)
Albumin: 2.1 g/dL — ABNORMAL LOW (ref 3.5–5.0)
Alkaline Phosphatase: 52 U/L (ref 38–126)
Alkaline Phosphatase: 47 U/L (ref 38–126)
Alkaline Phosphatase: 46 U/L (ref 38–126)
BUN: 21 mg/dL — ABNORMAL HIGH (ref 6–20)
BUN: 23 mg/dL — ABNORMAL HIGH (ref 6–20)
BUN: 28 mg/dL — ABNORMAL HIGH (ref 6–20)
CO2: 19 mmol/L — ABNORMAL LOW (ref 22–32)
CO2: 26 mmol/L (ref 22–32)
CO2: 25 mmol/L (ref 22–32)
Calcium: 10.2 mg/dL (ref 8.9–10.3)
Calcium: 9.7 mg/dL (ref 8.9–10.3)
Calcium: 8.9 mg/dL (ref 8.9–10.3)
Chloride: >130 mmol/L (ref 98–111)
Chloride: >130 mmol/L (ref 98–111)
Chloride: >130 mmol/L (ref 98–111)
Creatinine, Ser: 1.32 mg/dL — ABNORMAL HIGH (ref 0.61–1.24)
Creatinine, Ser: 1.57 mg/dL — ABNORMAL HIGH (ref 0.61–1.24)
Creatinine, Ser: 1.59 mg/dL — ABNORMAL HIGH (ref 0.61–1.24)
GFR, Estimated: >60 mL/min (ref >60)
GFR, Estimated: 59 mL/min — ABNORMAL LOW (ref >60)
GFR, Estimated: 58 mL/min — ABNORMAL LOW (ref >60)
Glucose, Bld: 132 mg/dL — ABNORMAL HIGH (ref 70–99)
Glucose, Bld: 266 mg/dL — ABNORMAL HIGH (ref 70–99)
Glucose, Bld: 209 mg/dL — ABNORMAL HIGH (ref 70–99)
Potassium: <2 mmol/L — CL (ref 3.5–5.1)
Potassium: <2 mmol/L — CL (ref 3.5–5.1)
Potassium: 2.1 mmol/L — CL (ref 3.5–5.1)
Sodium: 170 mmol/L (ref 135–145)
Sodium: 173 mmol/L (ref 135–145)
Sodium: 168 mmol/L (ref 135–145)
Total Bilirubin: 1.1 mg/dL (ref 0.3–1.2)
Total Bilirubin: 0.9 mg/dL (ref 0.3–1.2)
Total Bilirubin: 0.9 mg/dL (ref 0.3–1.2)
Total Protein: 6.3 g/dL — ABNORMAL LOW (ref 6.5–8.1)
Total Protein: 6.1 g/dL — ABNORMAL LOW (ref 6.5–8.1)
Total Protein: 5.6 g/dL — ABNORMAL LOW (ref 6.5–8.1)

## 2020-09-16 LAB — PROTIME-INR
INR: 1.6 — ABNORMAL HIGH (ref 0.8–1.2)
Prothrombin Time: 19.1 seconds — ABNORMAL HIGH (ref 11.4–15.2)

## 2020-09-16 LAB — GLUCOSE, CAPILLARY
Glucose-Capillary: 116 mg/dL — ABNORMAL HIGH (ref 70–99)
Glucose-Capillary: 133 mg/dL — ABNORMAL HIGH (ref 70–99)
Glucose-Capillary: 141 mg/dL — ABNORMAL HIGH (ref 70–99)
Glucose-Capillary: 175 mg/dL — ABNORMAL HIGH (ref 70–99)
Glucose-Capillary: 175 mg/dL — ABNORMAL HIGH (ref 70–99)
Glucose-Capillary: 179 mg/dL — ABNORMAL HIGH (ref 70–99)
Glucose-Capillary: 196 mg/dL — ABNORMAL HIGH (ref 70–99)

## 2020-09-16 LAB — ABO/RH: ABO/RH(D): O POS

## 2020-09-16 LAB — PHOSPHORUS
Phosphorus: <1 mg/dL — CL (ref 2.5–4.6)
Phosphorus: <1 mg/dL — CL (ref 2.5–4.6)

## 2020-09-16 LAB — LIPASE, BLOOD: Lipase: 38 U/L (ref 11–51)

## 2020-09-16 LAB — ECHOCARDIOGRAM COMPLETE
Area-P 1/2: 3.03 cm2
Height: 70 in
S' Lateral: 3.1 cm
Weight: 2172.85 oz

## 2020-09-16 LAB — TYPE AND SCREEN
ABO/RH(D): O POS
Antibody Screen: NEGATIVE

## 2020-09-16 LAB — MAGNESIUM
Magnesium: 2.3 mg/dL (ref 1.7–2.4)
Magnesium: 2.3 mg/dL (ref 1.7–2.4)

## 2020-09-16 LAB — AMYLASE: Amylase: 122 U/L — ABNORMAL HIGH (ref 28–100)

## 2020-09-16 LAB — BILIRUBIN, DIRECT: Bilirubin, Direct: 0.2 mg/dL (ref 0.0–0.2)

## 2020-09-16 LAB — APTT: aPTT: 44 seconds — ABNORMAL HIGH (ref 24–36)

## 2020-09-16 LAB — TROPONIN I (HIGH SENSITIVITY): Troponin I (High Sensitivity): 386 ng/L (ref <18)

## 2020-09-16 LAB — CALCIUM, IONIZED: Calcium, Ionized, Serum: 5 mg/dL (ref 4.5–5.6)

## 2020-09-16 MED ORDER — AMIODARONE LOAD VIA INFUSION
150.0000 mg | Freq: Once | INTRAVENOUS | Status: DC
  Filled 2020-09-16: qty 83.34

## 2020-09-16 MED ORDER — FREE WATER
300.0000 mL | Status: DC
  Administered 2020-09-16 – 2020-09-17 (×8): 300 mL

## 2020-09-16 MED ORDER — LEVOTHYROXINE SODIUM 100 MCG/5ML IV SOLN
20.0000 ug | Freq: Once | INTRAVENOUS | Status: AC
  Administered 2020-09-16: 20 ug via INTRAVENOUS
  Filled 2020-09-16: qty 5

## 2020-09-16 MED ORDER — AMIODARONE HCL IN DEXTROSE 360-4.14 MG/200ML-% IV SOLN: 60.0000 mg/h | INTRAVENOUS | Status: AC

## 2020-09-16 MED ORDER — SODIUM CHLORIDE 0.9 % IV SOLN
20.0000 ug/h | INTRAVENOUS | Status: DC
  Administered 2020-09-16 (×2): 20 ug/h via INTRAVENOUS
  Filled 2020-09-16 (×3): qty 10

## 2020-09-16 MED ORDER — SODIUM BICARBONATE 8.4 % IV SOLN
50.0000 meq | Freq: Once | INTRAVENOUS | Status: AC
  Administered 2020-09-16: 50 meq via INTRAVENOUS
  Filled 2020-09-16: qty 50

## 2020-09-16 MED ORDER — INSULIN ASPART 100 UNIT/ML IJ SOLN
20.0000 [IU] | Freq: Once | INTRAMUSCULAR | Status: AC
  Administered 2020-09-16: 20 [IU] via SUBCUTANEOUS

## 2020-09-16 MED ORDER — ALBUMIN HUMAN 5 % IV SOLN: 25.0000 g | Freq: Once | INTRAVENOUS | Status: AC

## 2020-09-16 MED ORDER — SODIUM CHLORIDE 0.45 % IV BOLUS
500.0000 mL | Freq: Once | INTRAVENOUS | Status: AC
  Administered 2020-09-16: 500 mL via INTRAVENOUS

## 2020-09-16 MED ORDER — PERFLUTREN LIPID MICROSPHERE
INTRAVENOUS | Status: AC
  Administered 2020-09-16: 4 mL via INTRAVENOUS
  Filled 2020-09-16: qty 10

## 2020-09-16 MED ORDER — SODIUM CHLORIDE 0.9 % IV SOLN
1000.0000 mg | Freq: Once | INTRAVENOUS | Status: AC
  Administered 2020-09-16: 1000 mg via INTRAVENOUS
  Filled 2020-09-16: qty 8

## 2020-09-16 MED ORDER — FREE WATER
200.0000 mL | Status: DC
  Administered 2020-09-16 (×3): 200 mL

## 2020-09-16 MED ORDER — SODIUM CHLORIDE (PF) 0.9 % IJ SOLN: 20.0000 [IU] | Freq: Once | INTRAMUSCULAR | Status: DC

## 2020-09-16 MED ORDER — DEXTROSE 50 % IV SOLN
1.0000 | Freq: Once | INTRAVENOUS | Status: AC
  Administered 2020-09-16: 50 mL via INTRAVENOUS
  Filled 2020-09-16: qty 50

## 2020-09-16 MED ORDER — LEVOTHYROXINE SODIUM 200 MCG IV SOLR: INTRAVENOUS | Status: DC

## 2020-09-16 MED ORDER — LEVOTHYROXINE SODIUM 100 MCG/5ML IV SOLN
20.0000 ug/h | INTRAVENOUS | Status: DC
  Administered 2020-09-16 – 2020-09-17 (×4): 20 ug/h via INTRAVENOUS
  Filled 2020-09-16 (×7): qty 5

## 2020-09-16 MED ORDER — POTASSIUM CHLORIDE 20 MEQ PO PACK
40.0000 meq | PACK | Freq: Once | ORAL | Status: AC
  Administered 2020-09-16: 40 meq
  Filled 2020-09-16: qty 2

## 2020-09-16 MED ORDER — KCL IN DEXTROSE-NACL 20-5-0.2 MEQ/L-%-% IV SOLN
INTRAVENOUS | Status: DC
  Filled 2020-09-16: qty 1000

## 2020-09-16 MED ORDER — ALBUMIN HUMAN 5 % IV SOLN
INTRAVENOUS | Status: AC
  Administered 2020-09-16: 25 g via INTRAVENOUS
  Filled 2020-09-16: qty 500

## 2020-09-16 MED ORDER — POTASSIUM CHLORIDE 10 MEQ/50ML IV SOLN
10.0000 meq | INTRAVENOUS | Status: AC
  Administered 2020-09-16 (×2): 10 meq via INTRAVENOUS
  Filled 2020-09-16 (×5): qty 50

## 2020-09-16 MED ORDER — FREE WATER
100.0000 mL | Status: DC
  Administered 2020-09-16 (×2): 100 mL

## 2020-09-16 MED ORDER — PERFLUTREN LIPID MICROSPHERE
1.0000 mL | INTRAVENOUS | Status: AC | PRN
  Filled 2020-09-16: qty 10

## 2020-09-16 MED ORDER — POTASSIUM PHOSPHATES 15 MMOLE/5ML IV SOLN
30.0000 mmol | Freq: Once | INTRAVENOUS | Status: AC
  Administered 2020-09-16: 30 mmol via INTRAVENOUS
  Filled 2020-09-16: qty 10

## 2020-09-16 MED ORDER — POTASSIUM CHLORIDE 10 MEQ/50ML IV SOLN
10.0000 meq | INTRAVENOUS | Status: AC
  Administered 2020-09-16 (×2): 10 meq via INTRAVENOUS
  Filled 2020-09-16 (×2): qty 50

## 2020-09-16 MED ORDER — INSULIN ASPART 100 UNIT/ML IJ SOLN
0.0000 [IU] | INTRAMUSCULAR | Status: DC
  Administered 2020-09-16: 2 [IU] via SUBCUTANEOUS
  Administered 2020-09-16: 3 [IU] via SUBCUTANEOUS
  Administered 2020-09-16: 2 [IU] via SUBCUTANEOUS
  Administered 2020-09-16: 3 [IU] via SUBCUTANEOUS
  Administered 2020-09-17: 5 [IU] via SUBCUTANEOUS
  Administered 2020-09-17: 3 [IU] via SUBCUTANEOUS
  Administered 2020-09-17: 8 [IU] via SUBCUTANEOUS
  Administered 2020-09-17 (×2): 3 [IU] via SUBCUTANEOUS

## 2020-09-16 MED ORDER — AMIODARONE HCL IN DEXTROSE 360-4.14 MG/200ML-% IV SOLN: 30.0000 mg/h | INTRAVENOUS | Status: DC

## 2020-09-16 NOTE — Progress Notes (Signed)
  Echocardiogram 2D Echocardiogram has been performed.  Darrell Brooks 09/16/2020, 3:04 PM

## 2020-09-16 NOTE — Progress Notes (Signed)
ABG results given to [facility] staff post O2 challenge.

## 2020-09-16 NOTE — TOC Progression Note (Addendum)
Transition of Care [facility] - Progression Note    Patient Details  Name: Darrell Brooks MRN: [id number] Date of Birth: 28-Jun-1985  Transition of [facility]) CM/SW Contact  Glennon Mac, RN Phone Number: 09/16/2020, [phone]  Clinical Narrative    Received this information from hospital lawyer, Marlene Lard:  Morton Peters,   You are correct.  The patient's spouse has decision making authority.  See the attached statute  90-21.13(c)(1)-(6) which I've highlighted.  Under  90-21.13(c) a patient's spouse has decision making authority for an incapacitated patient over the patient's parents, children, or siblings provided there is no guardian, power of attorney, or health care agent.  The fact that the patient's spouse is separated does not matter.  In [place] someone is a "spouse" until they are legally divorced.  There isn't anything under the law that says because a spouse is separated, but not divorced, they lose they're priority under  90-21.13(c).   Please let me know if you have questions or would like to discuss.   Thanks, Rennis Harding   From: Marlene Lard  Sent: 09-20-20 1:22 PM      Barriers to Discharge: Continued Medical Work up  Expected Discharge Plan and Services     Discharge Planning Services: CM Consult   Living arrangements for the past 2 months: Single Family Home                                       Social Determinants of Health (SDOH) Interventions    Readmission Risk Interventions No flowsheet data found.  Quintella Baton, RN, BSN  Trauma/Neuro ICU Case Manager [phone]
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
## 2020-09-17 ENCOUNTER — Encounter (HOSPITAL_COMMUNITY): Payer: No Typology Code available for payment source | Admitting: Anesthesiology

## 2020-09-17 ENCOUNTER — Inpatient Hospital Stay (HOSPITAL_COMMUNITY): Payer: No Typology Code available for payment source | Admitting: Anesthesiology

## 2020-09-17 ENCOUNTER — Encounter (HOSPITAL_COMMUNITY): Admission: EM | Disposition: E | Payer: Self-pay | Source: Home / Self Care

## 2020-09-17 ENCOUNTER — Ambulatory Visit (HOSPITAL_COMMUNITY)
Admission: RE | Admit: 2020-09-17 | Discharge: 2020-09-17 | Disposition: A | Discharge status: Home / Self Care | Payer: No Typology Code available for payment source | Source: Ambulatory Visit | Attending: *Deleted | Admitting: *Deleted

## 2020-09-17 HISTORY — PX: ORGAN PROCUREMENT: SHX5270

## 2020-09-17 LAB — POCT I-STAT 7, (LYTES, BLD GAS, ICA,H+H)
Acid-Base Excess: 0 mmol/L (ref 0.0–2.0)
Acid-Base Excess: 2 mmol/L (ref 0.0–2.0)
Acid-base deficit: 1 mmol/L (ref 0.0–2.0)
Bicarbonate: 22.9 mmol/L (ref 20.0–28.0)
Bicarbonate: 25.7 mmol/L (ref 20.0–28.0)
Bicarbonate: 21.9 mmol/L (ref 20.0–28.0)
Calcium, Ion: 1.14 mmol/L — ABNORMAL LOW (ref 1.15–1.40)
Calcium, Ion: 1.13 mmol/L — ABNORMAL LOW (ref 1.15–1.40)
Calcium, Ion: 1.12 mmol/L — ABNORMAL LOW (ref 1.15–1.40)
HCT: 27 % — ABNORMAL LOW (ref 39.0–52.0)
HCT: 25 % — ABNORMAL LOW (ref 39.0–52.0)
HCT: 24 % — ABNORMAL LOW (ref 39.0–52.0)
Hemoglobin: 9.2 g/dL — ABNORMAL LOW (ref 13.0–17.0)
Hemoglobin: 8.5 g/dL — ABNORMAL LOW (ref 13.0–17.0)
Hemoglobin: 8.2 g/dL — ABNORMAL LOW (ref 13.0–17.0)
O2 Saturation: 100 %
O2 Saturation: 100 %
O2 Saturation: 94 %
Patient temperature: 95.5
Patient temperature: 98.9
Patient temperature: 97.7
Potassium: 4.4 mmol/L (ref 3.5–5.1)
Potassium: 4.1 mmol/L (ref 3.5–5.1)
Potassium: 3.6 mmol/L (ref 3.5–5.1)
Sodium: 167 mmol/L (ref 135–145)
Sodium: 165 mmol/L (ref 135–145)
Sodium: 160 mmol/L — ABNORMAL HIGH (ref 135–145)
TCO2: 24 mmol/L (ref 22–32)
TCO2: 27 mmol/L (ref 22–32)
TCO2: 23 mmol/L (ref 22–32)
pCO2 arterial: 28.5 mmHg — ABNORMAL LOW (ref 32.0–48.0)
pCO2 arterial: 33.9 mmHg (ref 32.0–48.0)
pCO2 arterial: 28.1 mmHg — ABNORMAL LOW (ref 32.0–48.0)
pH, Arterial: 7.508 — ABNORMAL HIGH (ref 7.350–7.450)
pH, Arterial: 7.489 — ABNORMAL HIGH (ref 7.350–7.450)
pH, Arterial: 7.498 — ABNORMAL HIGH (ref 7.350–7.450)
pO2, Arterial: 231 mmHg — ABNORMAL HIGH (ref 83.0–108.0)
pO2, Arterial: 481 mmHg — ABNORMAL HIGH (ref 83.0–108.0)
pO2, Arterial: 63 mmHg — ABNORMAL LOW (ref 83.0–108.0)

## 2020-09-17 LAB — CBC
HCT: 26.6 % — ABNORMAL LOW (ref 39.0–52.0)
Hemoglobin: 8.6 g/dL — ABNORMAL LOW (ref 13.0–17.0)
MCH: 29 pg (ref 26.0–34.0)
MCHC: 32.3 g/dL (ref 30.0–36.0)
MCV: 89.6 fL (ref 80.0–100.0)
Platelets: 182 10*3/uL (ref 150–400)
RBC: 2.97 MIL/uL — ABNORMAL LOW (ref 4.22–5.81)
RDW: 15.5 % (ref 11.5–15.5)
WBC: 10.6 10*3/uL — ABNORMAL HIGH (ref 4.0–10.5)
nRBC: 0.3 % — ABNORMAL HIGH (ref 0.0–0.2)

## 2020-09-17 LAB — COMPREHENSIVE METABOLIC PANEL
ALT: 111 U/L — ABNORMAL HIGH (ref 0–44)
ALT: 111 U/L — ABNORMAL HIGH (ref 0–44)
ALT: 95 U/L — ABNORMAL HIGH (ref 0–44)
AST: 139 U/L — ABNORMAL HIGH (ref 15–41)
AST: 111 U/L — ABNORMAL HIGH (ref 15–41)
AST: 64 U/L — ABNORMAL HIGH (ref 15–41)
Albumin: 2.7 g/dL — ABNORMAL LOW (ref 3.5–5.0)
Albumin: 2.6 g/dL — ABNORMAL LOW (ref 3.5–5.0)
Albumin: 2.4 g/dL — ABNORMAL LOW (ref 3.5–5.0)
Alkaline Phosphatase: 61 U/L (ref 38–126)
Alkaline Phosphatase: 60 U/L (ref 38–126)
Alkaline Phosphatase: 57 U/L (ref 38–126)
Anion gap: 10 (ref 5–15)
BUN: 28 mg/dL — ABNORMAL HIGH (ref 6–20)
BUN: 28 mg/dL — ABNORMAL HIGH (ref 6–20)
BUN: 22 mg/dL — ABNORMAL HIGH (ref 6–20)
CO2: 21 mmol/L — ABNORMAL LOW (ref 22–32)
CO2: 21 mmol/L — ABNORMAL LOW (ref 22–32)
CO2: 22 mmol/L (ref 22–32)
Calcium: 8.5 mg/dL — ABNORMAL LOW (ref 8.9–10.3)
Calcium: 8.4 mg/dL — ABNORMAL LOW (ref 8.9–10.3)
Calcium: 7.7 mg/dL — ABNORMAL LOW (ref 8.9–10.3)
Chloride: >130 mmol/L (ref 98–111)
Chloride: >130 mmol/L (ref 98–111)
Chloride: 125 mmol/L — ABNORMAL HIGH (ref 98–111)
Creatinine, Ser: 1.55 mg/dL — ABNORMAL HIGH (ref 0.61–1.24)
Creatinine, Ser: 1.49 mg/dL — ABNORMAL HIGH (ref 0.61–1.24)
Creatinine, Ser: 1.25 mg/dL — ABNORMAL HIGH (ref 0.61–1.24)
GFR, Estimated: 60 mL/min — ABNORMAL LOW (ref >60)
GFR, Estimated: >60 mL/min (ref >60)
GFR, Estimated: >60 mL/min (ref >60)
Glucose, Bld: 247 mg/dL — ABNORMAL HIGH (ref 70–99)
Glucose, Bld: 300 mg/dL — ABNORMAL HIGH (ref 70–99)
Glucose, Bld: 204 mg/dL — ABNORMAL HIGH (ref 70–99)
Potassium: 5.3 mmol/L — ABNORMAL HIGH (ref 3.5–5.1)
Potassium: 4.1 mmol/L (ref 3.5–5.1)
Potassium: 3.6 mmol/L (ref 3.5–5.1)
Sodium: 167 mmol/L (ref 135–145)
Sodium: 163 mmol/L (ref 135–145)
Sodium: 157 mmol/L — ABNORMAL HIGH (ref 135–145)
Total Bilirubin: 1.3 mg/dL — ABNORMAL HIGH (ref 0.3–1.2)
Total Bilirubin: 1.2 mg/dL (ref 0.3–1.2)
Total Bilirubin: 0.7 mg/dL (ref 0.3–1.2)
Total Protein: 6.1 g/dL — ABNORMAL LOW (ref 6.5–8.1)
Total Protein: 6.1 g/dL — ABNORMAL LOW (ref 6.5–8.1)
Total Protein: 5.7 g/dL — ABNORMAL LOW (ref 6.5–8.1)

## 2020-09-17 LAB — URINE CULTURE: Culture: NO GROWTH

## 2020-09-17 LAB — URINALYSIS, COMPLETE (UACMP) WITH MICROSCOPIC
Bilirubin Urine: NEGATIVE
Glucose, UA: >=500 mg/dL — AB
Ketones, ur: 5 mg/dL — AB
Leukocytes,Ua: NEGATIVE
Nitrite: NEGATIVE
Protein, ur: 100 mg/dL — AB
Specific Gravity, Urine: 1.018 (ref 1.005–1.030)
pH: 9 — ABNORMAL HIGH (ref 5.0–8.0)

## 2020-09-17 LAB — GLUCOSE, CAPILLARY
Glucose-Capillary: 278 mg/dL — ABNORMAL HIGH (ref 70–99)
Glucose-Capillary: 246 mg/dL — ABNORMAL HIGH (ref 70–99)
Glucose-Capillary: 196 mg/dL — ABNORMAL HIGH (ref 70–99)
Glucose-Capillary: 162 mg/dL — ABNORMAL HIGH (ref 70–99)

## 2020-09-17 LAB — PHOSPHORUS
Phosphorus: 4.3 mg/dL (ref 2.5–4.6)
Phosphorus: 4.6 mg/dL (ref 2.5–4.6)

## 2020-09-17 LAB — MAGNESIUM
Magnesium: 2.1 mg/dL (ref 1.7–2.4)
Magnesium: 1.8 mg/dL (ref 1.7–2.4)

## 2020-09-17 LAB — PROTIME-INR
INR: 1.5 — ABNORMAL HIGH (ref 0.8–1.2)
Prothrombin Time: 17.7 seconds — ABNORMAL HIGH (ref 11.4–15.2)

## 2020-09-17 SURGERY — SURGICAL PROCUREMENT, ORGAN
Anesthesia: General

## 2020-09-17 MED ORDER — FREE WATER
450.0000 mL | Status: DC
  Administered 2020-09-17 (×5): 450 mL

## 2020-09-17 MED ORDER — FREE WATER: 300.0000 mL | Status: DC

## 2020-09-17 MED ORDER — VASOPRESSIN 20 UNIT/ML IV SOLN
INTRAVENOUS | Status: DC | PRN
  Administered 2020-09-17: 1 [IU] via INTRAVENOUS

## 2020-09-17 MED ORDER — FENTANYL CITRATE (PF) 250 MCG/5ML IJ SOLN
INTRAMUSCULAR | Status: DC | PRN
  Administered 2020-09-17 (×3): 50 ug via INTRAVENOUS
  Administered 2020-09-17: 150 ug via INTRAVENOUS

## 2020-09-17 MED ORDER — ALBUMIN HUMAN 25 % IV SOLN
25.0000 g | INTRAVENOUS | Status: DC
Start: 2020-09-18 — End: 2020-09-17

## 2020-09-17 MED ORDER — LACTATED RINGERS IV SOLN: INTRAVENOUS | Status: DC | PRN

## 2020-09-17 MED ORDER — LACTATED RINGERS IV BOLUS
2000.0000 mL | Freq: Once | INTRAVENOUS | Status: AC
  Administered 2020-09-17: 2000 mL via INTRAVENOUS

## 2020-09-17 MED ORDER — LACTATED RINGERS IV SOLN
INTRAVENOUS | Status: DC | PRN
Start: 2020-09-17 — End: 2020-09-17

## 2020-09-17 MED ORDER — ALBUMIN HUMAN 25 % IV SOLN: 12.5000 g | Freq: Once | INTRAVENOUS | Status: DC

## 2020-09-17 MED ORDER — FREE WATER: 200.0000 mL | Status: DC

## 2020-09-17 MED ORDER — STERILE WATER FOR INJECTION IV SOLN
50.0000 mg | INTRAVENOUS | Status: DC
  Filled 2020-09-17: qty 50

## 2020-09-17 MED ORDER — SODIUM CHLORIDE 0.9 % IV SOLN
1000.0000 mg | Freq: Once | INTRAVENOUS | Status: AC
  Administered 2020-09-17: 1000 mg via INTRAVENOUS
  Filled 2020-09-17: qty 8

## 2020-09-17 MED ORDER — HEPARIN SODIUM (PORCINE) 1000 UNIT/ML IJ SOLN
INTRAMUSCULAR | Status: DC | PRN
  Administered 2020-09-17: 30000 [IU] via INTRAVENOUS

## 2020-09-17 MED ORDER — ALBUMIN HUMAN 5 % IV SOLN
12.5000 g | Freq: Once | INTRAVENOUS | Status: AC
  Administered 2020-09-17: 12.5 g via INTRAVENOUS
  Filled 2020-09-17: qty 250

## 2020-09-17 MED ORDER — STERILE WATER FOR INJECTION IJ SOLN
100.0000 mL | Freq: Once | INTRAMUSCULAR | Status: DC
  Filled 2020-09-17 (×3): qty 100

## 2020-09-17 MED ORDER — DESMOPRESSIN ACETATE 4 MCG/ML IJ SOLN
1.0000 ug | INTRAMUSCULAR | Status: DC
  Administered 2020-09-17: 1 ug via INTRAVENOUS
  Filled 2020-09-17 (×2): qty 1

## 2020-09-17 MED ORDER — PHENYLEPHRINE 40 MCG/ML (10ML) SYRINGE FOR IV PUSH (FOR BLOOD PRESSURE SUPPORT)
PREFILLED_SYRINGE | INTRAVENOUS | Status: DC | PRN
  Administered 2020-09-17 (×2): 80 ug via INTRAVENOUS

## 2020-09-17 MED ORDER — 0.9 % SODIUM CHLORIDE (POUR BTL) OPTIME
TOPICAL | Status: DC | PRN
  Administered 2020-09-17 (×3): 1000 mL

## 2020-09-17 MED ORDER — DEXTROSE 5 % IV SOLN: INTRAVENOUS | Status: DC

## 2020-09-17 MED ORDER — ROCURONIUM BROMIDE 10 MG/ML (PF) SYRINGE
PREFILLED_SYRINGE | INTRAVENOUS | Status: DC | PRN
  Administered 2020-09-17: 50 mg via INTRAVENOUS
  Administered 2020-09-17: 100 mg via INTRAVENOUS

## 2020-09-17 MED ORDER — NITROGLYCERIN 0.2 MG/ML ON CALL CATH LAB
INTRAVENOUS | Status: DC | PRN
  Administered 2020-09-17 (×2): 20 ug via INTRAVENOUS

## 2020-09-17 MED ORDER — FENTANYL CITRATE (PF) 250 MCG/5ML IJ SOLN
INTRAMUSCULAR | Status: AC
  Filled 2020-09-17: qty 5

## 2020-09-17 MED ORDER — PROPOFOL 10 MG/ML IV BOLUS
INTRAVENOUS | Status: AC
  Filled 2020-09-17: qty 20

## 2020-09-17 MED ORDER — PHENYLEPHRINE HCL-NACL 10-0.9 MG/250ML-% IV SOLN
INTRAVENOUS | Status: DC | PRN
  Administered 2020-09-17: 25 ug/min via INTRAVENOUS

## 2020-09-17 MED ORDER — SODIUM CHLORIDE 0.9 % IV SOLN
INTRAVENOUS | Status: DC | PRN
Start: 2020-09-17 — End: 2020-09-17

## 2020-09-17 MED ORDER — FREE WATER: 450.0000 mL | Status: DC

## 2020-09-17 SURGICAL SUPPLY — 88 items
BAG COUNTER SPONGE SURGICOUNT (BAG) ×2 IMPLANT
BAG SPNG CNTER NS LX DISP (BAG) ×1
BLADE CLIPPER SURG (BLADE) IMPLANT
BLADE SAW STERNAL (BLADE) ×2 IMPLANT
BLADE STERNUM SYSTEM 6 (BLADE) ×2 IMPLANT
BLADE SURG 10 STRL SS (BLADE) IMPLANT
CLEANER TIP ELECTROSURG 2X2 (MISCELLANEOUS) ×2 IMPLANT
CLIP VESOCCLUDE MED 24/CT (CLIP) IMPLANT
CLIP VESOCCLUDE SM WIDE 24/CT (CLIP) IMPLANT
CNTNR URN SCR LID CUP LEK RST (MISCELLANEOUS) ×1 IMPLANT
CONT SPEC 4OZ STRL OR WHT (MISCELLANEOUS) ×2
COVER BACK TABLE 60X90IN (DRAPES) IMPLANT
COVER MAYO STAND STRL (DRAPES) IMPLANT
COVER SURGICAL LIGHT HANDLE (MISCELLANEOUS) ×2 IMPLANT
DRAPE HALF SHEET 40X57 (DRAPES) IMPLANT
DRAPE SLUSH MACHINE 52X66 (DRAPES) ×2 IMPLANT
DRSG COVADERM 4X10 (GAUZE/BANDAGES/DRESSINGS) IMPLANT
DRSG TELFA 3X8 NADH (GAUZE/BANDAGES/DRESSINGS) ×2 IMPLANT
DURAPREP 26ML APPLICATOR (WOUND CARE) IMPLANT
ELECT BLADE 6.5 EXT (BLADE) IMPLANT
ELECT REM PT RETURN 9FT ADLT (ELECTROSURGICAL) ×4
ELECTRODE REM PT RTRN 9FT ADLT (ELECTROSURGICAL) ×2 IMPLANT
GAUZE 4X4 16PLY ~~LOC~~+RFID DBL (SPONGE) IMPLANT
GAUZE SPONGE 4X4 12PLY STRL (GAUZE/BANDAGES/DRESSINGS) ×2 IMPLANT
GLOVE SRG 8 PF TXTR STRL LF DI (GLOVE) IMPLANT
GLOVE SURG ENC MOIS LTX SZ7 (GLOVE) IMPLANT
GLOVE SURG ENC MOIS LTX SZ7.5 (GLOVE) IMPLANT
GLOVE SURG ENC MOIS LTX SZ8 (GLOVE) IMPLANT
GLOVE SURG ENC MOIS LTX SZ8.5 (GLOVE) IMPLANT
GLOVE SURG POLYISO LF SZ7 (GLOVE) IMPLANT
GLOVE SURG POLYISO LF SZ7.5 (GLOVE) ×8 IMPLANT
GLOVE SURG POLYISO LF SZ8 (GLOVE) IMPLANT
GLOVE SURG UNDER POLY LF SZ6.5 (GLOVE) ×10 IMPLANT
GLOVE SURG UNDER POLY LF SZ7 (GLOVE) IMPLANT
GLOVE SURG UNDER POLY LF SZ7.5 (GLOVE) ×8 IMPLANT
GLOVE SURG UNDER POLY LF SZ8 (GLOVE)
GLOVE SURG UNDER POLY LF SZ8.5 (GLOVE) IMPLANT
GOWN STRL REUS W/ TWL LRG LVL3 (GOWN DISPOSABLE) ×4 IMPLANT
GOWN STRL REUS W/ TWL XL LVL3 (GOWN DISPOSABLE) ×4 IMPLANT
GOWN STRL REUS W/TWL LRG LVL3 (GOWN DISPOSABLE) ×8
GOWN STRL REUS W/TWL XL LVL3 (GOWN DISPOSABLE) ×8
HANDLE SUCTION POOLE (INSTRUMENTS) IMPLANT
KIT POST MORTEM ADULT 36X90 (BAG) ×2 IMPLANT
KIT TURNOVER KIT B (KITS) ×2 IMPLANT
LIGASURE IMPACT 36 18CM CVD LR (INSTRUMENTS) ×2 IMPLANT
LOOP VESSEL MAXI BLUE (MISCELLANEOUS) IMPLANT
LOOP VESSEL MINI RED (MISCELLANEOUS) ×2 IMPLANT
MANIFOLD NEPTUNE II (INSTRUMENTS) ×2 IMPLANT
NEEDLE BIOPSY 14X6 SOFT TISS (NEEDLE) IMPLANT
NS IRRIG 1000ML POUR BTL (IV SOLUTION) IMPLANT
PACK AORTA (CUSTOM PROCEDURE TRAY) ×2 IMPLANT
PAD ARMBOARD 7.5X6 YLW CONV (MISCELLANEOUS) ×4 IMPLANT
PENCIL BUTTON HOLSTER BLD 10FT (ELECTRODE) ×2 IMPLANT
RELOAD LINEAR CUT PROX 55 BLUE (ENDOMECHANICALS) ×6 IMPLANT
SOL PREP POV-IOD 4OZ 10% (MISCELLANEOUS) ×4 IMPLANT
SPONGE INTESTINAL PEANUT (DISPOSABLE) IMPLANT
SPONGE T-LAP 18X18 ~~LOC~~+RFID (SPONGE) IMPLANT
STAPLER PROXIMATE 55 BLUE (STAPLE) ×2 IMPLANT
STAPLER VISISTAT 35W (STAPLE) ×4 IMPLANT
SUCTION POOLE HANDLE (INSTRUMENTS)
SUT BONE WAX W31G (SUTURE) IMPLANT
SUT ETHIBOND 5 LR DA (SUTURE) IMPLANT
SUT ETHILON 1 LR 30 (SUTURE) ×4 IMPLANT
SUT ETHILON 2 LR (SUTURE) IMPLANT
SUT PROLENE 3 0 RB 1 (SUTURE) IMPLANT
SUT PROLENE 3 0 SH 1 (SUTURE) IMPLANT
SUT PROLENE 4 0 RB 1 (SUTURE)
SUT PROLENE 4-0 RB1 .5 CRCL 36 (SUTURE) IMPLANT
SUT PROLENE 5 0 C 1 24 (SUTURE) IMPLANT
SUT PROLENE 6 0 BV (SUTURE) ×2 IMPLANT
SUT SILK 0 TIES 10X30 (SUTURE) IMPLANT
SUT SILK 1 SH (SUTURE) IMPLANT
SUT SILK 1 TIES 10X30 (SUTURE) IMPLANT
SUT SILK 2 0 (SUTURE)
SUT SILK 2 0 SH (SUTURE) IMPLANT
SUT SILK 2 0 SH CR/8 (SUTURE) IMPLANT
SUT SILK 2 0 TIES 10X30 (SUTURE) IMPLANT
SUT SILK 2-0 18XBRD TIE 12 (SUTURE) IMPLANT
SUT SILK 3 0 SH CR/8 (SUTURE) IMPLANT
SUT SILK 3 0 TIES 10X30 (SUTURE) IMPLANT
SWAB COLLECTION DEVICE MRSA (MISCELLANEOUS) IMPLANT
SWAB CULTURE ESWAB REG 1ML (MISCELLANEOUS) IMPLANT
SYR 50ML LL SCALE MARK (SYRINGE) IMPLANT
SYRINGE TOOMEY DISP (SYRINGE) IMPLANT
TAPE UMBILICAL 1/8 X36 TWILL (MISCELLANEOUS) IMPLANT
TUBE CONNECTING 12X1/4 (SUCTIONS) ×2 IMPLANT
WATER STERILE IRR 1000ML POUR (IV SOLUTION) IMPLANT
YANKAUER SUCT BULB TIP NO VENT (SUCTIONS) ×2 IMPLANT

## 2020-09-17 NOTE — Progress Notes (Signed)
Oxygen challenge ordered and performed without complications.  ABG sample was obtained on ventilator settings of VT: 480, RR: 20, FIO2: 100%, and PEEP: 5.0.  Placed patient back on original ventilator settings of PEEP of 8 and FIO2 of 30%.  Will continue to monitor.    Ref. Range 08/28/2020 08:47  Sample type Unknown ARTERIAL  pH, Arterial Latest Ref Range: 7.350 - 7.450  7.489 (H)  pCO2 arterial Latest Ref Range: 32.0 - 48.0 mmHg 33.9  pO2, Arterial Latest Ref Range: 83.0 - 108.0 mmHg 481 (H)  TCO2 Latest Ref Range: 22 - 32 mmol/L 27  Acid-Base Excess Latest Ref Range: 0.0 - 2.0 mmol/L 2.0  Bicarbonate Latest Ref Range: 20.0 - 28.0 mmol/L 25.7  O2 Saturation Latest Units: % 100.0  Patient temperature Unknown 98.9 F  Collection site Unknown art line

## 2020-09-17 NOTE — Procedures (Signed)
Arterial Catheter Insertion Procedure Note  Darrell Brooks  [id number]  [birth year]  Date:09/01/2020  Time:3:10 AM    Provider Performing: Morley Kos    Procedure: Insertion of Arterial Line (63016) with US guidance (01093)   Indication(s) Blood pressure monitoring and/or need for frequent ABGs  Consent Unable to obtain consent due to emergent nature of procedure.  Anesthesia None   Time Out Verified patient identification, verified procedure, site/side was marked, verified correct patient position, special equipment/implants available, medications/allergies/relevant history reviewed, required imaging and test results available.   Sterile Technique Maximal sterile technique including full sterile barrier drape, hand hygiene, sterile gown, sterile gloves, mask, hair covering, sterile ultrasound probe cover (if used).   Procedure Description Area of catheter insertion was cleaned with chlorhexidine and draped in sterile fashion. With real-time ultrasound guidance an arterial catheter was placed into the left radial artery.  Appropriate arterial tracings confirmed on monitor.     Complications/Tolerance None; patient tolerated the procedure well.   EBL Minimal   Specimen(s) None

## 2020-09-17 NOTE — Progress Notes (Signed)
Per Dr. Bedelia Person VO, increase Free Water to q2h do not clamp and suction.

## 2020-09-17 NOTE — Progress Notes (Signed)
[facility] called and requested a new O2 challenge and ABG, passed info on to RRT.

## 2020-09-17 NOTE — Progress Notes (Signed)
Date and time results received: Oct 17, 2020 0705   Test: Na Critical Value: 163  Test: Cl Critical Value:  >130  Name of Provider Notified: Vilma Meckel  Orders Received? Or Actions Taken?:  Modify free water to q1h Increase D5 to 171mL/hr

## 2020-09-17 NOTE — Progress Notes (Signed)
RT NOTE:  RT attempted ALINE placement due to inability to thread catheter.  Fernande Boyden, RT & [place], RT both attempted arterial placement x2 on right side. Kim, RT attempted x2 on left without success.   MD made aware.

## 2020-09-17 NOTE — Transfer of Care (Signed)
Immediate Anesthesia Transfer of Care Note  Patient: Darrell Brooks  Procedure(s) Performed: ORGAN PROCUREMENT HEART, LUNGS, KIDNEYS, PANCREAS, LIVER  Patient Location: OR 12  Anesthesia Type:General  Level of Consciousness: Deceased  Airway & Oxygen Therapy: ET tube remains in place. Ventilation stopped   Post-op Assessment: Organ Procurement team at bedside   Post vital signs:   Last Vitals:  Vitals Value Taken Time  BP    Temp    Pulse    Resp    SpO2      Last Pain:  Vitals:   09/16/2020 1800  TempSrc: Bladder         Complications: No notable events documented.

## 2020-09-17 NOTE — OR Nursing (Signed)
Cross-clamp time 2055  Brain death time 09/26/2020 1350 Times when organs out of body: Heart-2103 Lungs-2122 Pancreas - 2130 Liver - 2130 Right Kidney - 2135 Left Kidney - 2139

## 2020-09-17 NOTE — Anesthesia Postprocedure Evaluation (Signed)
Anesthesia Post Note  Patient: Aaran Enberg  Procedure(s) Performed: ORGAN PROCUREMENT HEART, LUNGS, KIDNEYS, PANCREAS, LIVER     Patient location: OR. Anesthesia Type: General Post-procedure mental status: organ donor. Anesthetic complications: no   No notable events documented.  Last Vitals:  Vitals:   09-23-2020 1830 2020/09/23 1845  BP:    Pulse: 82 81  Resp: 20 20  Temp:    SpO2: (!) 85% (!) 89%    Last Pain:  Vitals:   23-Sep-2020 1800  TempSrc: Bladder                 Chau Savell P Tanis Burnley

## 2020-09-17 NOTE — Anesthesia Preprocedure Evaluation (Signed)
Anesthesia Evaluation  Patient identified by MRN, date of birth, ID band Patient unresponsive    Reviewed: Allergy & Precautions, NPO status , Patient's Chart, lab work & pertinent test results  Airway Mallampati: Intubated       Dental   Pulmonary  Intubated      + intubated    Cardiovascular negative cardio ROS   Rhythm:Regular Rate:Normal  ECHO: 1. Left ventricular ejection fraction, by estimation, is 60 to 65%. The left ventricle has normal function. The left ventricle has no regional wall motion abnormalities. Left ventricular diastolic parameters are consistent with Grade I diastolic dysfunction (impaired relaxation). 2. Right ventricular systolic function is normal. The right ventricular size is mildly enlarged. 3. The mitral valve is normal in structure. No evidence of mitral valve regurgitation. No evidence of mitral stenosis. 4. The aortic valve is tricuspid. Aortic valve regurgitation is not visualized. 5. The inferior vena cava is dilated in size with >50% respiratory variability, suggesting right atrial pressure of 8 mmHg.   Neuro/Psych Anoxic brain injury     GI/Hepatic negative GI ROS, Neg liver ROS,   Endo/Other  Hypernatremia  Renal/GU negative Renal ROS     Musculoskeletal negative musculoskeletal ROS (+)   Abdominal   Peds  Hematology  (+) anemia ,   Anesthesia Other Findings ORGAN PROCUREMENT   Reproductive/Obstetrics                             Anesthesia Physical Anesthesia Plan  ASA: 6  Anesthesia Plan: General   Post-op Pain Management:    Induction:   PONV Risk Score and Plan: 2 and Treatment may vary due to age or medical condition  Airway Management Planned: Oral ETT  Additional Equipment:   Intra-op Plan:   Post-operative Plan:   Informed Consent:   Plan Discussed with: CRNA  Anesthesia Plan Comments: (Left radial arterial line Right  subclavian central line)        Anesthesia Quick Evaluation
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
## 2020-09-17 NOTE — Progress Notes (Signed)
Per VO from Dr. Bedelia Person, bolus 2L LR 985mL/hr and stop vasopressin due to hypertension.

## 2020-09-17 NOTE — Progress Notes (Signed)
Per face-to-face VO by Dr Judeth Horn, revert free water and D5 orders back to previous and check with trauma MD.

## 2020-09-17 NOTE — Progress Notes (Signed)
Patient with output over last hour. Spoke with Weston Brass from [employer] regarding administration of DDAVP. Used clinical judgement to hold at this time due to patient receiving 2L bolus. Will continue to monitor.

## 2020-09-18 LAB — POCT I-STAT 7, (LYTES, BLD GAS, ICA,H+H)
Acid-base deficit: 1 mmol/L (ref 0.0–2.0)
Acid-base deficit: 3 mmol/L — ABNORMAL HIGH (ref 0.0–2.0)
Acid-base deficit: 4 mmol/L — ABNORMAL HIGH (ref 0.0–2.0)
Acid-base deficit: 3 mmol/L — ABNORMAL HIGH (ref 0.0–2.0)
Bicarbonate: 23 mmol/L (ref 20.0–28.0)
Bicarbonate: 23 mmol/L (ref 20.0–28.0)
Bicarbonate: 25.4 mmol/L (ref 20.0–28.0)
Bicarbonate: 23.8 mmol/L (ref 20.0–28.0)
Calcium, Ion: 1.04 mmol/L — ABNORMAL LOW (ref 1.15–1.40)
Calcium, Ion: 1.09 mmol/L — ABNORMAL LOW (ref 1.15–1.40)
Calcium, Ion: 1.11 mmol/L — ABNORMAL LOW (ref 1.15–1.40)
Calcium, Ion: 1.08 mmol/L — ABNORMAL LOW (ref 1.15–1.40)
HCT: 23 % — ABNORMAL LOW (ref 39.0–52.0)
HCT: 23 % — ABNORMAL LOW (ref 39.0–52.0)
HCT: 24 % — ABNORMAL LOW (ref 39.0–52.0)
HCT: 25 % — ABNORMAL LOW (ref 39.0–52.0)
Hemoglobin: 7.8 g/dL — ABNORMAL LOW (ref 13.0–17.0)
Hemoglobin: 7.8 g/dL — ABNORMAL LOW (ref 13.0–17.0)
Hemoglobin: 8.2 g/dL — ABNORMAL LOW (ref 13.0–17.0)
Hemoglobin: 8.5 g/dL — ABNORMAL LOW (ref 13.0–17.0)
O2 Saturation: 100 %
O2 Saturation: 100 %
O2 Saturation: 100 %
O2 Saturation: 100 %
Patient temperature: 34.6
Patient temperature: 34.7
Patient temperature: 35.2
Potassium: 3.2 mmol/L — ABNORMAL LOW (ref 3.5–5.1)
Potassium: 3.6 mmol/L (ref 3.5–5.1)
Potassium: 4.1 mmol/L (ref 3.5–5.1)
Potassium: 3.7 mmol/L (ref 3.5–5.1)
Sodium: 160 mmol/L — ABNORMAL HIGH (ref 135–145)
Sodium: 161 mmol/L (ref 135–145)
Sodium: 162 mmol/L (ref 135–145)
Sodium: 161 mmol/L (ref 135–145)
TCO2: 24 mmol/L (ref 22–32)
TCO2: 24 mmol/L (ref 22–32)
TCO2: 27 mmol/L (ref 22–32)
TCO2: 25 mmol/L (ref 22–32)
pCO2 arterial: 42.5 mmHg (ref 32.0–48.0)
pCO2 arterial: 44.3 mmHg (ref 32.0–48.0)
pCO2 arterial: 44.6 mmHg (ref 32.0–48.0)
pCO2 arterial: 47.9 mmHg (ref 32.0–48.0)
pH, Arterial: 7.314 — ABNORMAL LOW (ref 7.350–7.450)
pH, Arterial: 7.329 — ABNORMAL LOW (ref 7.350–7.450)
pH, Arterial: 7.352 (ref 7.350–7.450)
pH, Arterial: 7.305 — ABNORMAL LOW (ref 7.350–7.450)
pO2, Arterial: 238 mmHg — ABNORMAL HIGH (ref 83.0–108.0)
pO2, Arterial: 293 mmHg — ABNORMAL HIGH (ref 83.0–108.0)
pO2, Arterial: 366 mmHg — ABNORMAL HIGH (ref 83.0–108.0)
pO2, Arterial: 208 mmHg — ABNORMAL HIGH (ref 83.0–108.0)

## 2020-09-19 LAB — CULTURE, RESPIRATORY W GRAM STAIN

## 2020-09-20 ENCOUNTER — Encounter (HOSPITAL_COMMUNITY): Payer: Self-pay

## 2020-09-20 LAB — CULTURE, BLOOD (ROUTINE X 2)
Culture: NO GROWTH
Special Requests: ADEQUATE
Special Requests: ADEQUATE

## 2020-09-27 NOTE — Progress Notes (Signed)
Developed hypotension without tachycardia.  Abdomen soft, chest tube appears functional with no air leak. Check labs and chest x ray.  Most likely related to herniation from TBI with anoxic brain injury.  Starting levophed to support blood pressure, will need to continue family discussions of goals of care.

## 2020-09-27 NOTE — Procedures (Signed)
Bronchoscopy Procedure Note  Darrell Brooks  [id number]  April 20, 1985  Date:September 24, 2020  Time:6:27 PM   Provider Performing:Aariya Ferrick   Procedure(s):  Flexible bronchoscopy with bronchial alveolar lavage (24825)  Indication(s) Organ donation  Time Out Verified patient identification, verified procedure, site/side was marked, verified correct patient position, special equipment/implants available, medications/allergies/relevant history reviewed, required imaging and test results available.   Sterile Technique Usual hand hygiene, masks, gowns, and gloves were used   Procedure Description Bronchoscope advanced through endotracheal tube and into airway.  Airways were examined down to subsegmental level with findings noted below.    BAL was performed with 50 cc of normal saline in the right middle lobe and left lingula and sent for testing.  Findings: Mucopurulent secretions noted in the right lower lobe and right middle lobe.     Complications/Tolerance None; patient tolerated the procedure well. Chest X-ray is not needed post procedure.   EBL Minimal   Specimen(s) BAL  Darrell Garfinkel MD [facility] See Amion for pager  If no response to pager , please call [phone] until 7pm After 7:00 pm call Elink  [phone] 24-Sep-2020, 6:28 PM

## 2020-09-27 NOTE — Procedures (Signed)
Arterial Catheter Insertion Procedure Note  Darrell Brooks  [id number]  August 09, 1985  Date:Sep 30, 2020  Time:5:44 PM    Provider Performing: Jacqulynn Cadet    Procedure: Insertion of Arterial Line (40981) without US guidance  Indication(s) Blood pressure monitoring and/or need for frequent ABGs  Consent DONOR services requested, consent not needed  Anesthesia None   Time Out Verified patient identification, verified procedure, site/side was marked, verified correct patient position, special equipment/implants available, medications/allergies/relevant history reviewed, required imaging and test results available.   Sterile Technique Maximal sterile technique including full sterile barrier drape, hand hygiene, sterile gown, sterile gloves, mask, hair covering, sterile ultrasound probe cover (if used).   Procedure Description Area of catheter insertion was cleaned with chlorhexidine and draped in sterile fashion. With real-time ultrasound guidance an arterial catheter was placed into the right radial artery.  Appropriate arterial tracings confirmed on monitor.     Complications/Tolerance None; patient tolerated the procedure well.   EBL None   Specimen(s) None
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
## 2020-09-27 NOTE — Procedures (Signed)
Central Venous Catheter Insertion Procedure Note  Darrell Brooks  [id number]  1985/09/24  Date:10-14-2020  Time:6:30 PM   Provider Performing:Jelena Malicoat   Procedure: Insertion of Non-tunneled Central Venous Catheter(36556) with US guidance (46568)   Indication(s) Medication administration  Consent Unable to obtain consent due to emergent nature of procedure.  Anesthesia Topical only with 1% lidocaine   Timeout Verified patient identification, verified procedure, site/side was marked, verified correct patient position, special equipment/implants available, medications/allergies/relevant history reviewed, required imaging and test results available.  Sterile Technique Maximal sterile technique including full sterile barrier drape, hand hygiene, sterile gown, sterile gloves, mask, hair covering, sterile ultrasound probe cover (if used).  Procedure Description Area of catheter insertion was cleaned with chlorhexidine and draped in sterile fashion.  With real-time ultrasound guidance a central venous catheter was placed into the right subclavian vein. Nonpulsatile blood flow and easy flushing noted in all ports.  The catheter was sutured in place and sterile dressing applied.  Complications/Tolerance None; patient tolerated the procedure well. Chest X-Darrell is ordered to verify placement for internal jugular or subclavian cannulation.   Chest x-Darrell is not ordered for femoral cannulation.  EBL Minimal  Specimen(s) None

## 2020-09-27 NOTE — TOC Progression Note (Signed)
Transition of Care [facility]) - Progression Note    Patient Details  Name: Darrell Brooks MRN: [id number] Date of Birth: [birth year]  Transition of Care [facility]) CM/SW Contact  Glennon Mac, RN Phone Number: 09/02/2020, 1:34 PM  Clinical Narrative:   Telephone call to pt's sister, Darrell Brooks, per her request. Phone # [phone].  She states that her lawyer has stated that pt's wife has no decision-making, as pt/wife are legally separated.  I have reached out to [facility] legal services to weigh in on this issue, as all noted documentation is stating that legal separation does not apply, and wife would be the current decision maker.  Will follow with updates as they are available.       Barriers to Discharge: Continued Medical Work up  Expected Discharge Plan and Services     Discharge Planning Services: CM Consult   Living arrangements for the past 2 months: Single Family Home                                       Social Determinants of Health (SDOH) Interventions    Readmission Risk Interventions No flowsheet data found.  Quintella Baton, RN, BSN  Trauma/Neuro ICU Case Manager [phone]

## 2020-09-27 NOTE — Procedures (Signed)
Bedside Bronchoscopy Procedure Note Darrell Brooks [id number] [birth year]  Procedure: Bronchoscopy Indications: Obtain specimens for culture and/or other diagnostic studies  Procedure Details: ET Tube Size: 7.5 ET Tube secured at lip (cm): 25 Bite block in place: No In preparation for procedure, Patient hyper-oxygenated with 100 % FiO2 Airway entered and the following bronchi were examined: RUL, RML, RLL, LUL, LLL, and Bronchi.   Bronchoscope removed. Patient remained on 100% FiO2 throughout the procedure.    Evaluation BP 110/74   Pulse 83   Temp (!) 96.9 F (36.1 C) (Axillary)   Resp 20   Ht 6' (1.829 m)   Wt 67.7 kg   SpO2 95%   BMI 20.24 kg/m  Breath Sounds:Diminished and Rhonch O2 sats: stable throughout Patient's Current Condition: DONOR patient Specimens: Sent BAL Complications: No apparent complications Patient did tolerate procedure well.   Kathie Dike 09/16/2020, 6:11 PM

## 2020-09-27 NOTE — Progress Notes (Signed)
   07-Oct-2020 0000  Provider Notification  Provider Name/Title Dr Hayden Rasmussen  Date Provider Notified 10/07/2020  Time Provider Notified 0000  Notification Type Page  Notification Reason Change in status (hypotension)  Provider response Evaluate remotely;See new orders  Date of Provider Response 07-Oct-2020  Time of Provider Response 0005   Trauma MD is reviewing chart and will consider new orders for pressors. RN will continue to monitor

## 2020-09-27 NOTE — Progress Notes (Signed)
Patient had an NM brain perfusion study, which showed no intracranial perfusion. Exam performed by Dr. Fredricka Bonine this morning showed apnea and absence of brain stem reflexes. Physical exam findings of brain death are confirmed by the perfusion study. Time of death declared at 1350. I discussed this with the patient's family including his wife, mother, father and 5 sisters with the assistance of a Spanish interpreter.  Sophronia Simas, MD [facility] Surgery General, Hepatobiliary and Pancreatic Surgery 10-06-20 2:48 PM

## 2020-09-27 NOTE — Progress Notes (Signed)
Patient transported to Nuclear Med for brain flow study on the ventilator then back to room 4N32 with no problems.
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
## 2020-09-27 DEATH — deceased

## 2023-06-14 IMAGING — DX DG CHEST 1V PORT
1 series · 2 of 2 positions shown · non-contrast
Comparison: 09/13/2020

CLINICAL DATA: Right pneumothorax

EXAM:
PORTABLE CHEST 1 VIEW

[Series 1: chest ap · 0.14mm/px · 2 of 2 slices shown]
[im 1/2]
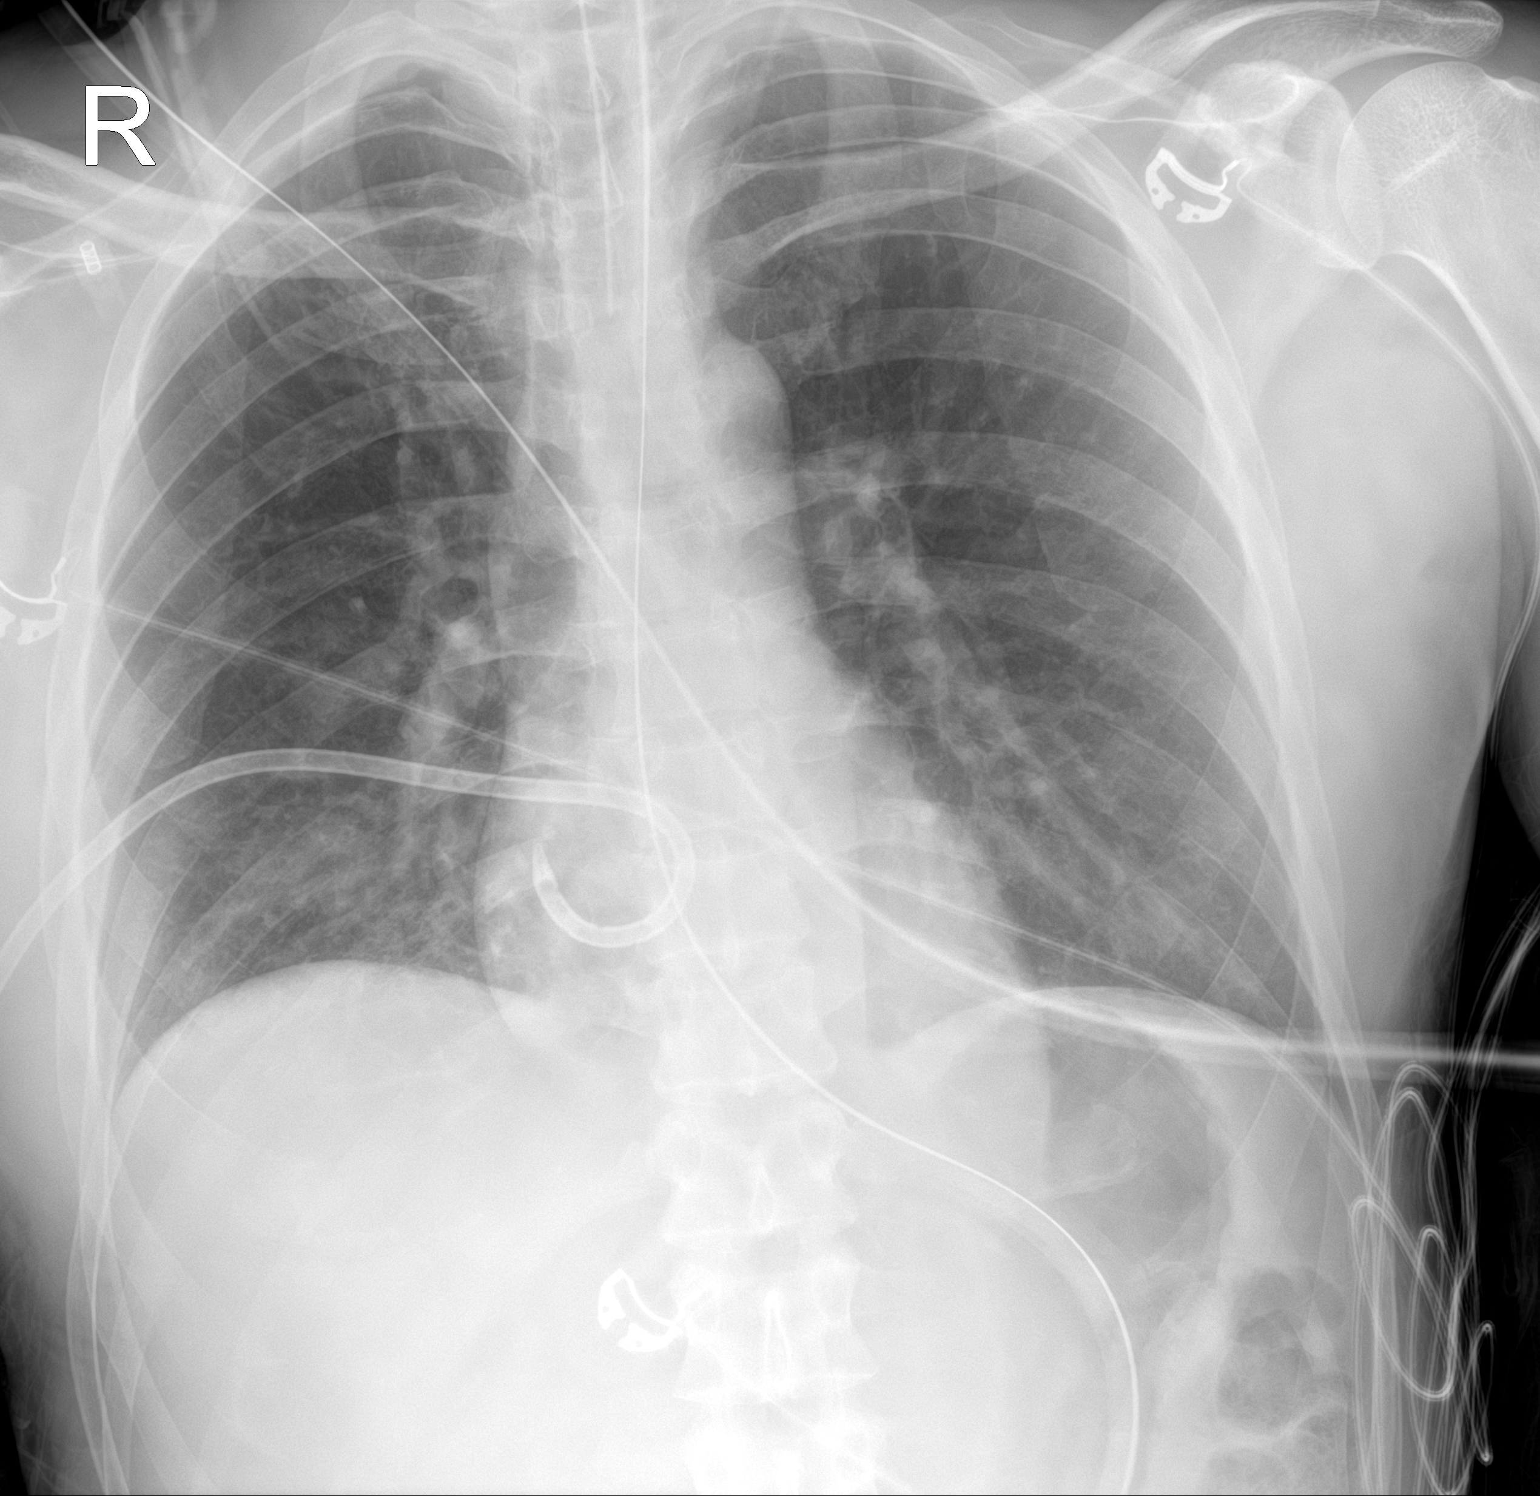
[im 2/2]
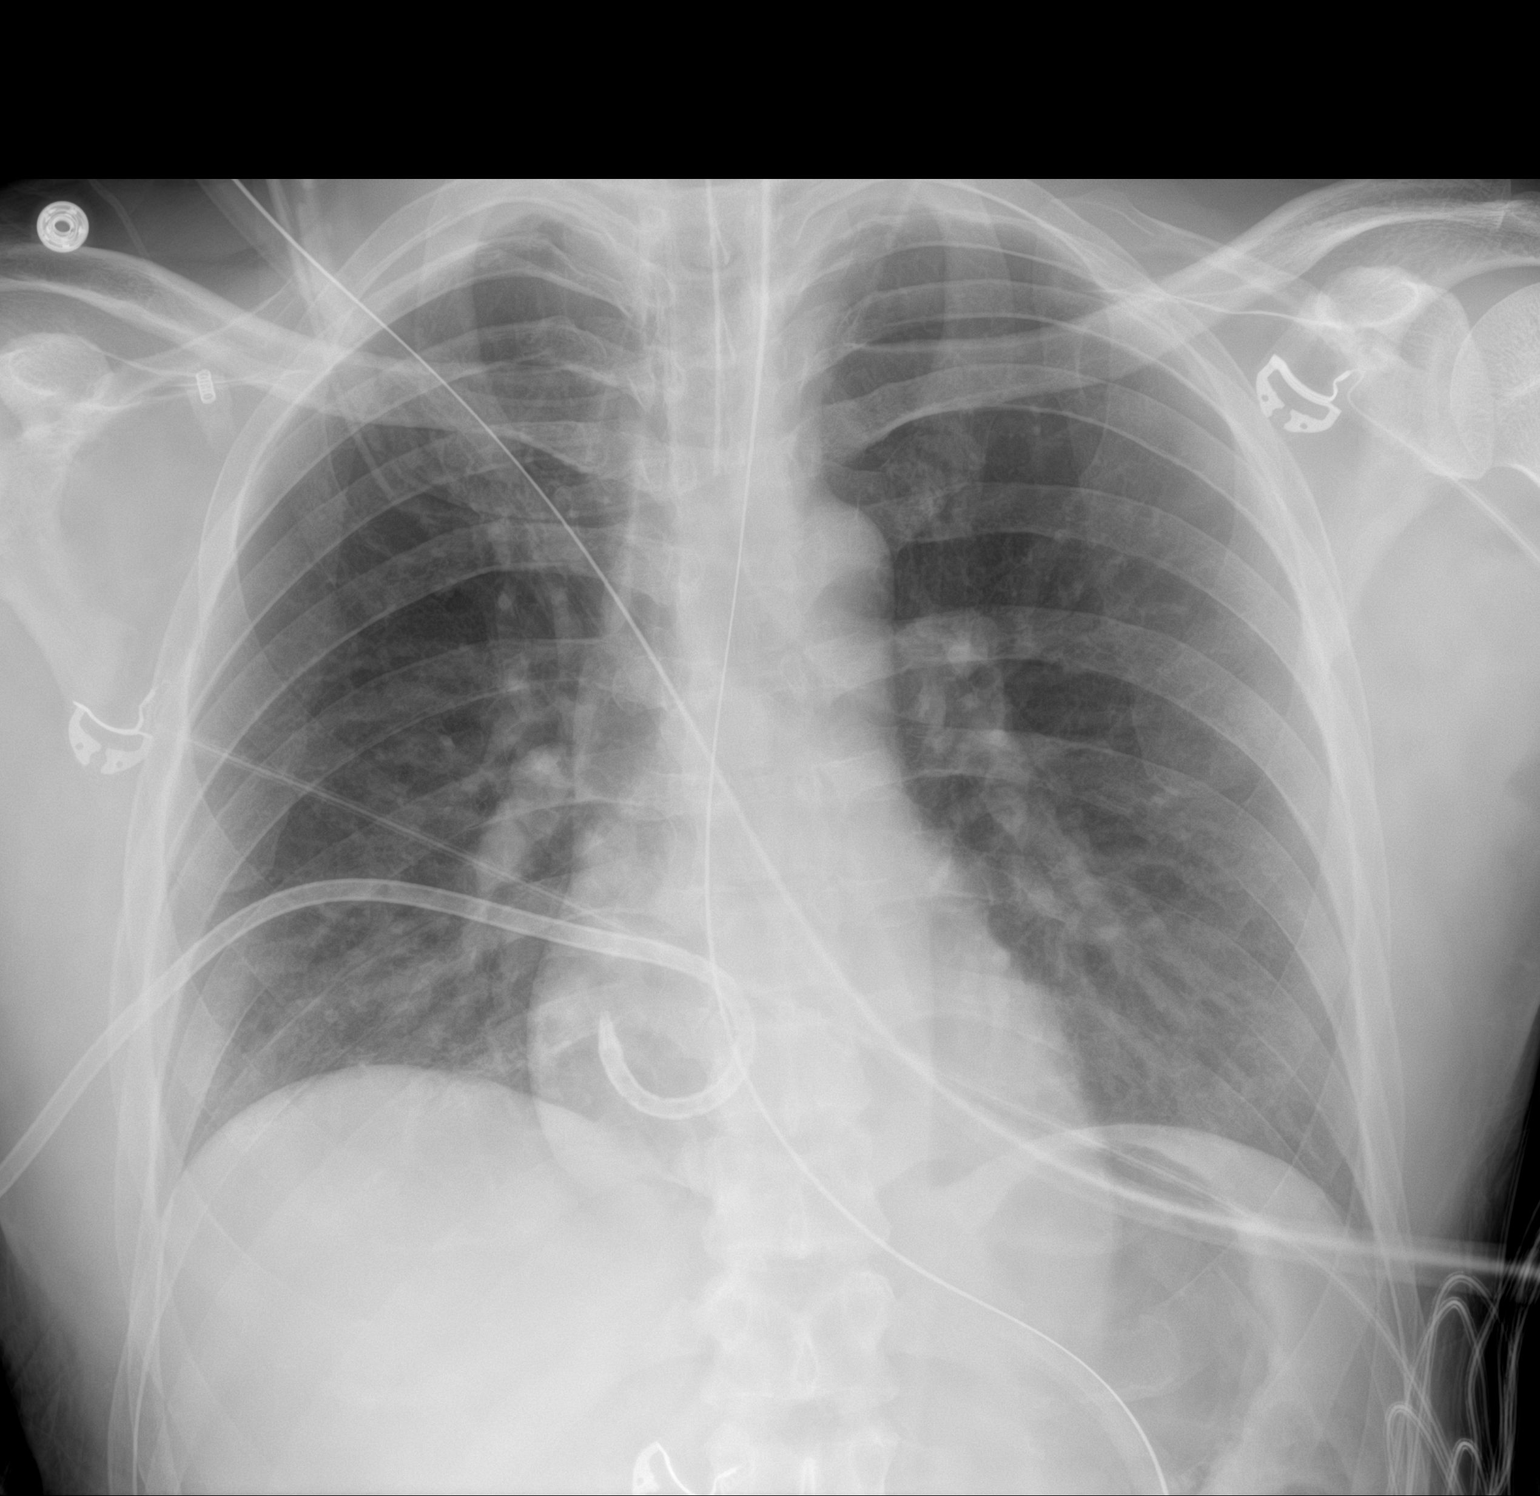

[2 of 2 positions shown; findings below may reference images not displayed]

FINDINGS: Endotracheal tube seen 4.2 cm above the carina. Nasogastric tube
extends into the upper abdomen beyond the margin of the examination.
Right basilar pigtail chest tube is unchanged. Right apical pleural
fluid is unchanged. No pneumothorax. Minimal right basilar
atelectasis or infiltrate. Left lung is clear. No pleural effusion.
Cardiac size within normal limits. Pulmonary vascularity is normal.
IMPRESSION: Support catheters unchanged.

Right basilar chest tube unchanged.  No pneumothorax.

Minimal right basilar atelectasis or infiltrate.

## 2023-06-16 IMAGING — CR DG CHEST 1V PORT
2 series · 2 of 2 positions shown · non-contrast
Comparison: Chest x-ray 09/16/2020, x-ray abdomen 09/12/2020

CLINICAL DATA: Portable images in OR after procurement to check for
anything left behind.

EXAM:
PORTABLE CHEST 1 VIEW

[AP (1 of 2)]
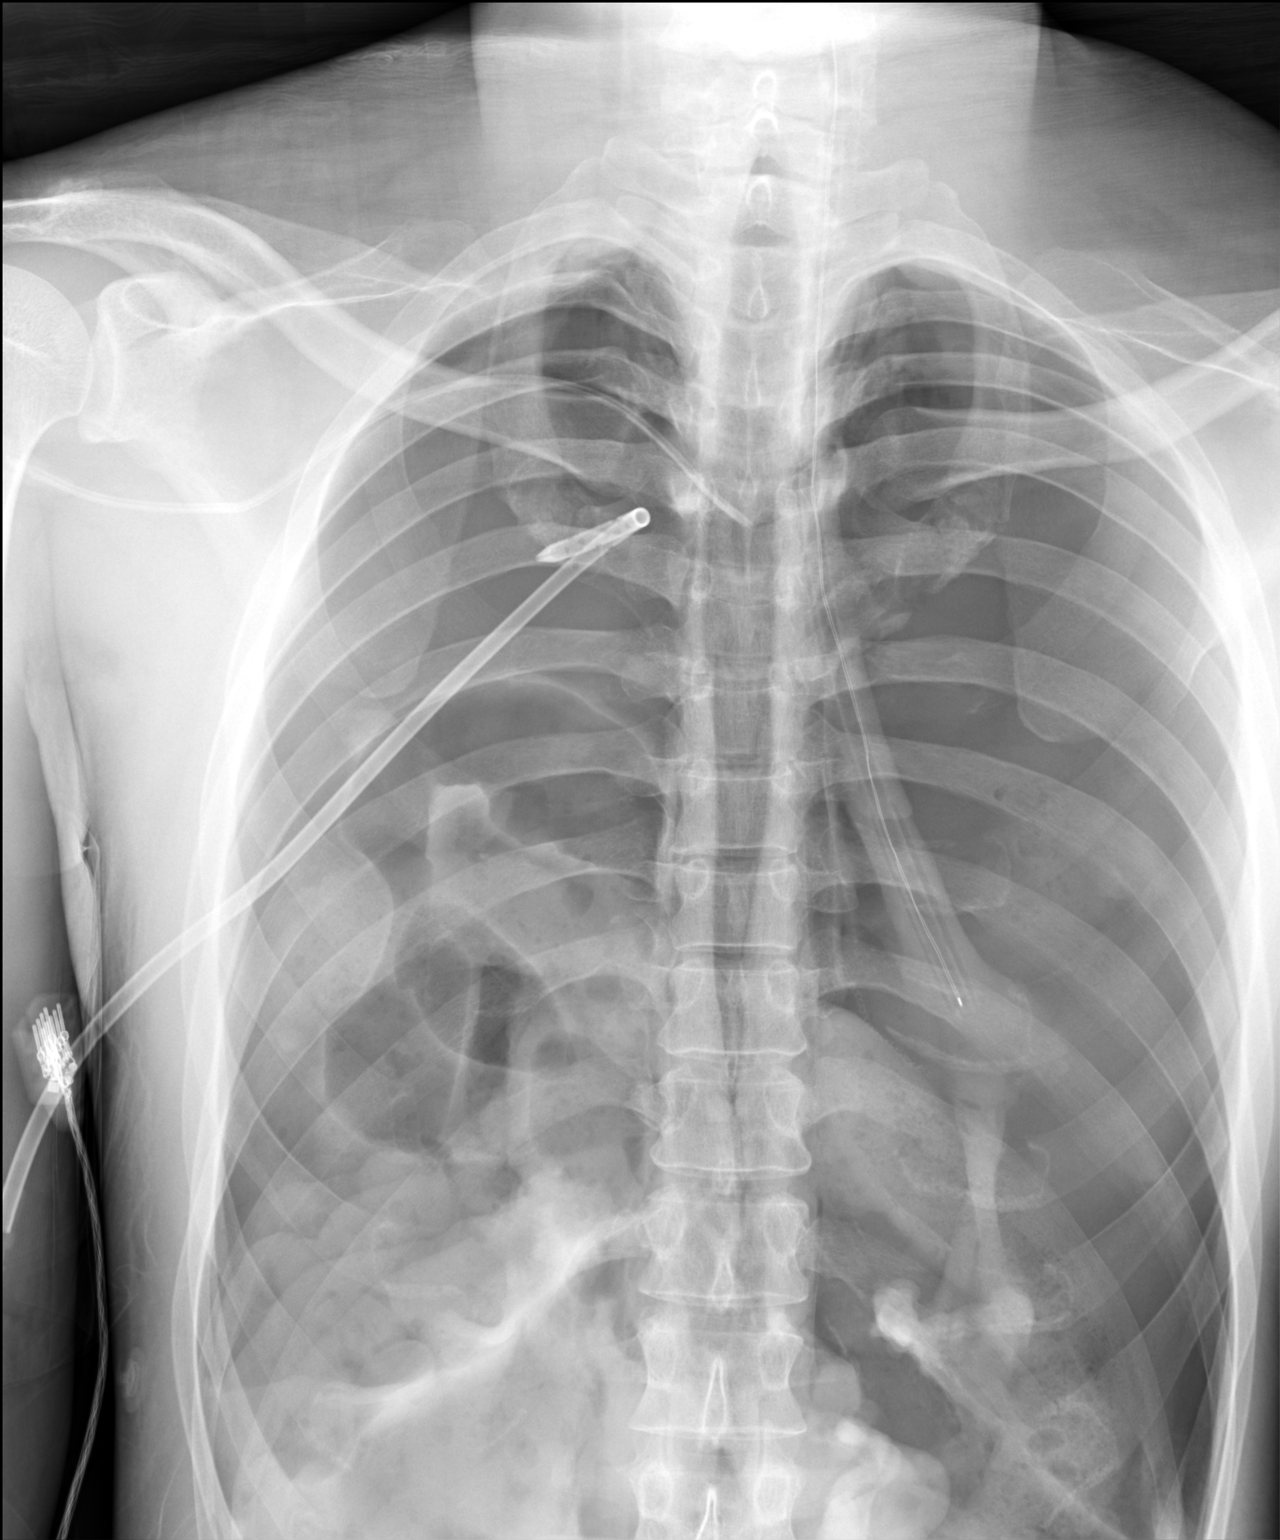

[AP (2 of 2)]
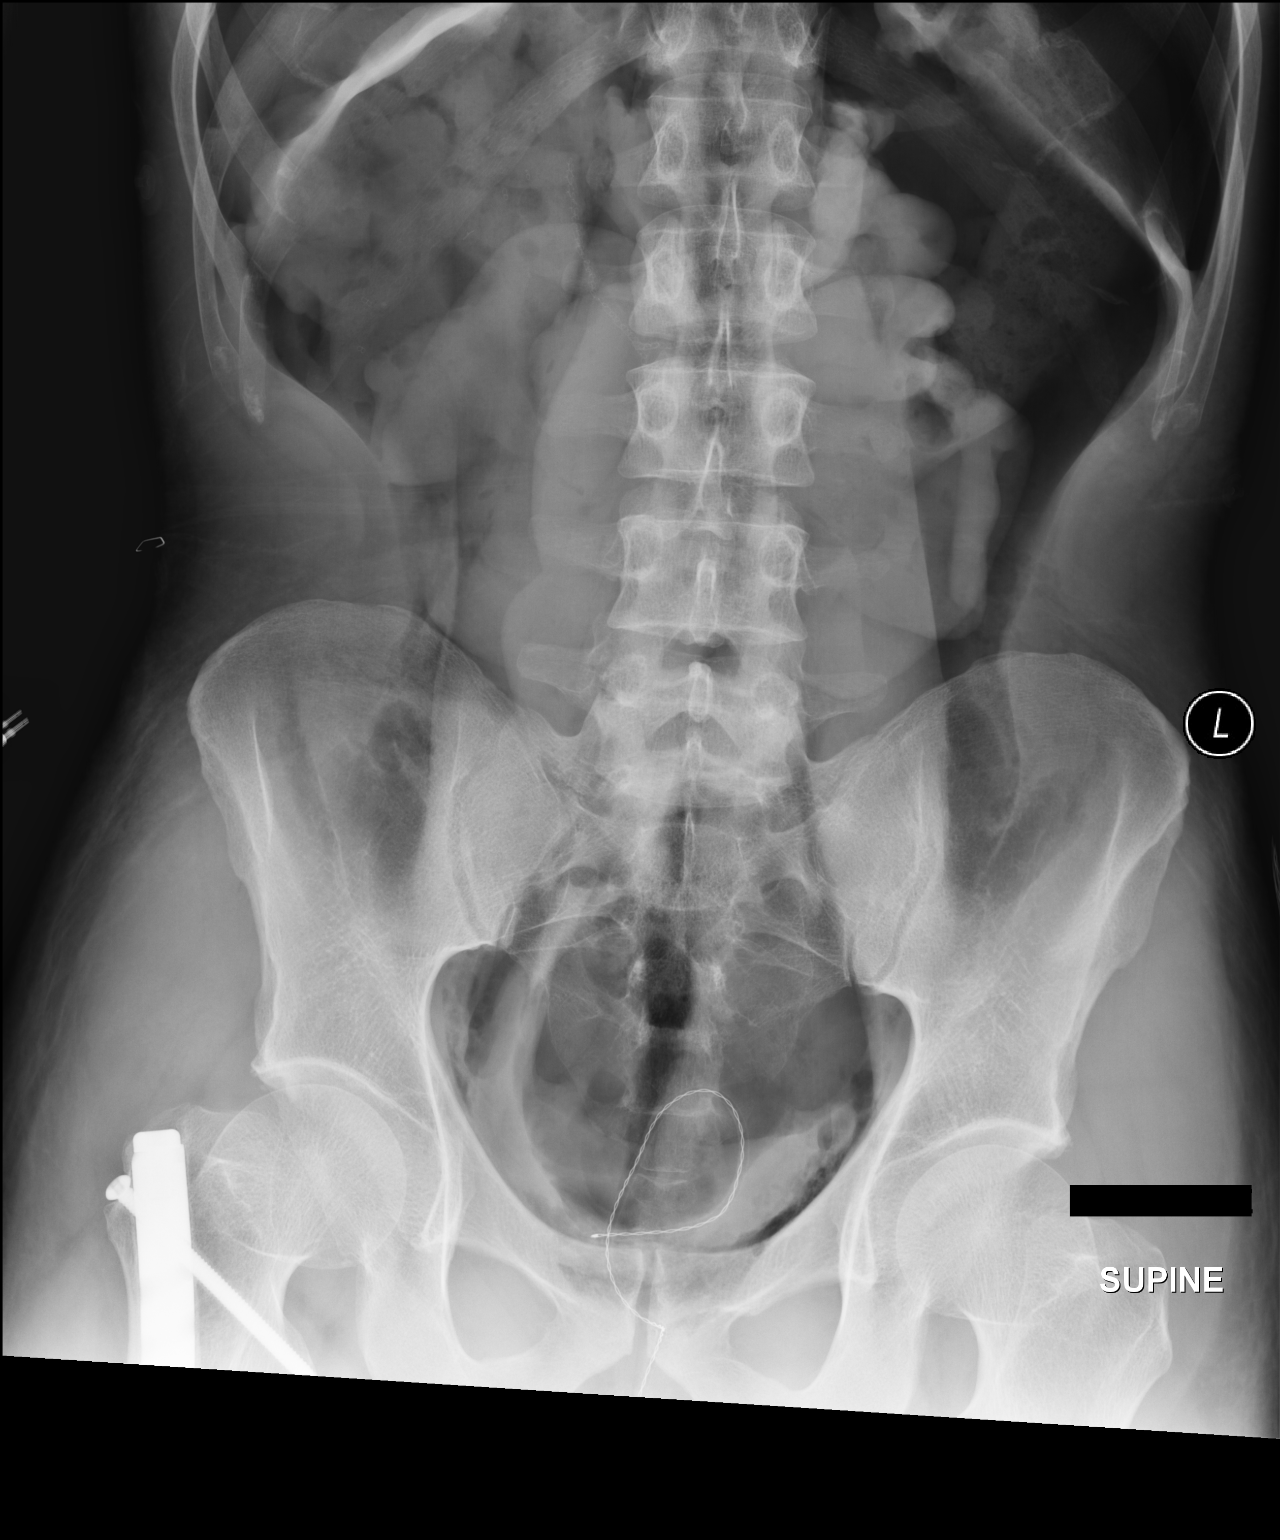

[2 of 2 positions shown; findings below may reference images not displayed]

FINDINGS: Marked lucency of bilateral lung fields. Marked lucency of the
abdomen. Anastomotic sutures overlying the mid abdomen. Temperature
probe overlying the expected region of the urinary bladder.
Temperature probe overlying the likely left main stem bronchus.
Right pigtail chest tube overlying the right hemithorax. Right
subclavian venous central catheter with tip overlying the T5-T6
intervertebral disc space. Partially visualized intramedullary nail
fixation of the proximal right femur. No retained surgical
instruments or hardware noted.
IMPRESSION: After procurement chest x-ray and abdomen x-ray as described above.

These results were called by telephone at the time of interpretation
on 09/17/2020 at [DATE] to Jaylon Karlic Pole, who verbally
acknowledged these results.
